# Patient Record
Sex: Male | Born: 1945 | Race: White | Hispanic: No | Marital: Single | Smoking: Former smoker
Health system: Southern US, Community
[De-identification: ages and names within clinical notes are randomized; demographics above are authoritative.]

## PROBLEM LIST (undated history)

## (undated) DIAGNOSIS — C801 Malignant (primary) neoplasm, unspecified: Secondary | ICD-10-CM

## (undated) DIAGNOSIS — E079 Disorder of thyroid, unspecified: Secondary | ICD-10-CM

## (undated) DIAGNOSIS — H409 Unspecified glaucoma: Secondary | ICD-10-CM

## (undated) DIAGNOSIS — B029 Zoster without complications: Secondary | ICD-10-CM

## (undated) HISTORY — PX: CHOLECYSTECTOMY: SHX55

## (undated) HISTORY — PX: OTHER SURGICAL HISTORY: SHX169

## (undated) SURGERY — SIGMOIDOSCOPY, FLEXIBLE
Anesthesia: Moderate Sedation

---

## 2001-07-29 ENCOUNTER — Encounter: Payer: Self-pay | Admitting: Family Medicine

## 2001-07-29 ENCOUNTER — Ambulatory Visit (HOSPITAL_COMMUNITY): Admission: RE | Admit: 2001-07-29 | Discharge: 2001-07-29 | Payer: Self-pay | Admitting: Family Medicine

## 2001-10-31 ENCOUNTER — Ambulatory Visit (HOSPITAL_COMMUNITY): Admission: RE | Admit: 2001-10-31 | Discharge: 2001-10-31 | Payer: Self-pay | Admitting: Internal Medicine

## 2003-02-05 ENCOUNTER — Other Ambulatory Visit: Admission: RE | Admit: 2003-02-05 | Discharge: 2003-02-05 | Payer: Self-pay | Admitting: Dermatology

## 2003-11-15 ENCOUNTER — Ambulatory Visit (HOSPITAL_COMMUNITY): Admission: RE | Admit: 2003-11-15 | Discharge: 2003-11-15 | Payer: Self-pay | Admitting: Family Medicine

## 2003-11-23 ENCOUNTER — Ambulatory Visit (HOSPITAL_COMMUNITY): Admission: RE | Admit: 2003-11-23 | Discharge: 2003-11-23 | Payer: Self-pay | Admitting: Family Medicine

## 2003-11-27 ENCOUNTER — Ambulatory Visit (HOSPITAL_COMMUNITY): Admission: RE | Admit: 2003-11-27 | Discharge: 2003-11-27 | Payer: Self-pay | Admitting: Internal Medicine

## 2004-02-14 ENCOUNTER — Other Ambulatory Visit: Admission: RE | Admit: 2004-02-14 | Discharge: 2004-02-14 | Payer: Self-pay | Admitting: Unknown Physician Specialty

## 2007-04-12 ENCOUNTER — Encounter (HOSPITAL_COMMUNITY): Admission: RE | Admit: 2007-04-12 | Discharge: 2007-05-12 | Payer: Self-pay | Admitting: Endocrinology

## 2007-04-26 ENCOUNTER — Encounter: Payer: Self-pay | Admitting: Endocrinology

## 2007-10-10 ENCOUNTER — Ambulatory Visit (HOSPITAL_COMMUNITY): Admission: RE | Admit: 2007-10-10 | Discharge: 2007-10-10 | Payer: Self-pay | Admitting: Family Medicine

## 2011-04-29 DIAGNOSIS — E890 Postprocedural hypothyroidism: Secondary | ICD-10-CM | POA: Diagnosis not present

## 2011-04-29 DIAGNOSIS — I10 Essential (primary) hypertension: Secondary | ICD-10-CM | POA: Diagnosis not present

## 2011-04-29 DIAGNOSIS — E039 Hypothyroidism, unspecified: Secondary | ICD-10-CM | POA: Diagnosis not present

## 2011-05-08 DIAGNOSIS — R059 Cough, unspecified: Secondary | ICD-10-CM | POA: Diagnosis not present

## 2011-05-08 DIAGNOSIS — Z6828 Body mass index (BMI) 28.0-28.9, adult: Secondary | ICD-10-CM | POA: Diagnosis not present

## 2011-05-08 DIAGNOSIS — J069 Acute upper respiratory infection, unspecified: Secondary | ICD-10-CM | POA: Diagnosis not present

## 2011-05-08 DIAGNOSIS — R05 Cough: Secondary | ICD-10-CM | POA: Diagnosis not present

## 2011-06-02 DIAGNOSIS — N401 Enlarged prostate with lower urinary tract symptoms: Secondary | ICD-10-CM | POA: Diagnosis not present

## 2011-06-02 DIAGNOSIS — N320 Bladder-neck obstruction: Secondary | ICD-10-CM | POA: Diagnosis not present

## 2011-06-02 DIAGNOSIS — N138 Other obstructive and reflux uropathy: Secondary | ICD-10-CM | POA: Diagnosis not present

## 2011-06-16 DIAGNOSIS — N401 Enlarged prostate with lower urinary tract symptoms: Secondary | ICD-10-CM | POA: Diagnosis not present

## 2011-06-16 DIAGNOSIS — N138 Other obstructive and reflux uropathy: Secondary | ICD-10-CM | POA: Diagnosis not present

## 2011-06-30 DIAGNOSIS — M19019 Primary osteoarthritis, unspecified shoulder: Secondary | ICD-10-CM | POA: Diagnosis not present

## 2011-09-02 DIAGNOSIS — Z006 Encounter for examination for normal comparison and control in clinical research program: Secondary | ICD-10-CM | POA: Diagnosis not present

## 2011-09-02 DIAGNOSIS — Z1289 Encounter for screening for malignant neoplasm of other sites: Secondary | ICD-10-CM | POA: Diagnosis not present

## 2011-09-02 DIAGNOSIS — C439 Malignant melanoma of skin, unspecified: Secondary | ICD-10-CM | POA: Diagnosis not present

## 2011-09-07 DIAGNOSIS — D485 Neoplasm of uncertain behavior of skin: Secondary | ICD-10-CM | POA: Diagnosis not present

## 2011-09-07 DIAGNOSIS — L570 Actinic keratosis: Secondary | ICD-10-CM | POA: Diagnosis not present

## 2011-09-07 DIAGNOSIS — Z85828 Personal history of other malignant neoplasm of skin: Secondary | ICD-10-CM | POA: Diagnosis not present

## 2011-10-12 DIAGNOSIS — M25569 Pain in unspecified knee: Secondary | ICD-10-CM | POA: Diagnosis not present

## 2011-10-24 DIAGNOSIS — K529 Noninfective gastroenteritis and colitis, unspecified: Secondary | ICD-10-CM | POA: Diagnosis not present

## 2011-10-24 DIAGNOSIS — B9789 Other viral agents as the cause of diseases classified elsewhere: Secondary | ICD-10-CM | POA: Diagnosis not present

## 2011-10-24 DIAGNOSIS — Z6828 Body mass index (BMI) 28.0-28.9, adult: Secondary | ICD-10-CM | POA: Diagnosis not present

## 2011-10-24 DIAGNOSIS — M159 Polyosteoarthritis, unspecified: Secondary | ICD-10-CM | POA: Diagnosis not present

## 2011-10-24 DIAGNOSIS — M549 Dorsalgia, unspecified: Secondary | ICD-10-CM | POA: Diagnosis not present

## 2011-12-08 DIAGNOSIS — R197 Diarrhea, unspecified: Secondary | ICD-10-CM | POA: Diagnosis not present

## 2011-12-11 DIAGNOSIS — E785 Hyperlipidemia, unspecified: Secondary | ICD-10-CM | POA: Diagnosis not present

## 2011-12-11 DIAGNOSIS — R197 Diarrhea, unspecified: Secondary | ICD-10-CM | POA: Diagnosis not present

## 2011-12-11 DIAGNOSIS — Z125 Encounter for screening for malignant neoplasm of prostate: Secondary | ICD-10-CM | POA: Diagnosis not present

## 2012-01-18 ENCOUNTER — Encounter (INDEPENDENT_AMBULATORY_CARE_PROVIDER_SITE_OTHER): Payer: Self-pay

## 2012-01-21 DIAGNOSIS — M19019 Primary osteoarthritis, unspecified shoulder: Secondary | ICD-10-CM | POA: Diagnosis not present

## 2012-03-09 DIAGNOSIS — Z006 Encounter for examination for normal comparison and control in clinical research program: Secondary | ICD-10-CM | POA: Diagnosis not present

## 2012-03-09 DIAGNOSIS — C439 Malignant melanoma of skin, unspecified: Secondary | ICD-10-CM | POA: Diagnosis not present

## 2012-03-09 DIAGNOSIS — Z0389 Encounter for observation for other suspected diseases and conditions ruled out: Secondary | ICD-10-CM | POA: Diagnosis not present

## 2012-03-15 DIAGNOSIS — Z85828 Personal history of other malignant neoplasm of skin: Secondary | ICD-10-CM | POA: Diagnosis not present

## 2012-03-15 DIAGNOSIS — L570 Actinic keratosis: Secondary | ICD-10-CM | POA: Diagnosis not present

## 2012-03-29 DIAGNOSIS — M159 Polyosteoarthritis, unspecified: Secondary | ICD-10-CM | POA: Diagnosis not present

## 2012-03-29 DIAGNOSIS — R51 Headache: Secondary | ICD-10-CM | POA: Diagnosis not present

## 2012-03-29 DIAGNOSIS — T148XXA Other injury of unspecified body region, initial encounter: Secondary | ICD-10-CM | POA: Diagnosis not present

## 2012-04-06 DIAGNOSIS — Z23 Encounter for immunization: Secondary | ICD-10-CM | POA: Diagnosis not present

## 2012-04-15 DIAGNOSIS — H269 Unspecified cataract: Secondary | ICD-10-CM | POA: Diagnosis not present

## 2012-04-15 DIAGNOSIS — H40019 Open angle with borderline findings, low risk, unspecified eye: Secondary | ICD-10-CM | POA: Diagnosis not present

## 2012-04-19 DIAGNOSIS — H2510 Age-related nuclear cataract, unspecified eye: Secondary | ICD-10-CM | POA: Diagnosis not present

## 2012-04-19 DIAGNOSIS — H04129 Dry eye syndrome of unspecified lacrimal gland: Secondary | ICD-10-CM | POA: Diagnosis not present

## 2012-04-19 DIAGNOSIS — H4011X0 Primary open-angle glaucoma, stage unspecified: Secondary | ICD-10-CM | POA: Diagnosis not present

## 2012-04-20 DIAGNOSIS — H01009 Unspecified blepharitis unspecified eye, unspecified eyelid: Secondary | ICD-10-CM | POA: Diagnosis not present

## 2012-04-20 DIAGNOSIS — H4011X0 Primary open-angle glaucoma, stage unspecified: Secondary | ICD-10-CM | POA: Diagnosis not present

## 2012-04-25 DIAGNOSIS — H4011X0 Primary open-angle glaucoma, stage unspecified: Secondary | ICD-10-CM | POA: Diagnosis not present

## 2012-04-25 DIAGNOSIS — H2510 Age-related nuclear cataract, unspecified eye: Secondary | ICD-10-CM | POA: Diagnosis not present

## 2012-04-25 DIAGNOSIS — H01009 Unspecified blepharitis unspecified eye, unspecified eyelid: Secondary | ICD-10-CM | POA: Diagnosis not present

## 2012-05-02 DIAGNOSIS — E890 Postprocedural hypothyroidism: Secondary | ICD-10-CM | POA: Diagnosis not present

## 2012-05-04 DIAGNOSIS — I10 Essential (primary) hypertension: Secondary | ICD-10-CM | POA: Diagnosis not present

## 2012-05-04 DIAGNOSIS — E890 Postprocedural hypothyroidism: Secondary | ICD-10-CM | POA: Diagnosis not present

## 2012-05-26 DIAGNOSIS — H4011X0 Primary open-angle glaucoma, stage unspecified: Secondary | ICD-10-CM | POA: Diagnosis not present

## 2012-05-26 DIAGNOSIS — H2510 Age-related nuclear cataract, unspecified eye: Secondary | ICD-10-CM | POA: Diagnosis not present

## 2012-05-26 DIAGNOSIS — H04129 Dry eye syndrome of unspecified lacrimal gland: Secondary | ICD-10-CM | POA: Diagnosis not present

## 2012-06-13 DIAGNOSIS — M25519 Pain in unspecified shoulder: Secondary | ICD-10-CM | POA: Diagnosis not present

## 2012-06-13 DIAGNOSIS — M47812 Spondylosis without myelopathy or radiculopathy, cervical region: Secondary | ICD-10-CM | POA: Diagnosis not present

## 2012-06-13 DIAGNOSIS — M19019 Primary osteoarthritis, unspecified shoulder: Secondary | ICD-10-CM | POA: Diagnosis not present

## 2012-06-13 DIAGNOSIS — M542 Cervicalgia: Secondary | ICD-10-CM | POA: Diagnosis not present

## 2012-06-16 DIAGNOSIS — M47812 Spondylosis without myelopathy or radiculopathy, cervical region: Secondary | ICD-10-CM | POA: Diagnosis not present

## 2012-06-16 DIAGNOSIS — S4350XA Sprain of unspecified acromioclavicular joint, initial encounter: Secondary | ICD-10-CM | POA: Diagnosis not present

## 2012-06-22 DIAGNOSIS — H4011X0 Primary open-angle glaucoma, stage unspecified: Secondary | ICD-10-CM | POA: Diagnosis not present

## 2012-06-22 DIAGNOSIS — H2510 Age-related nuclear cataract, unspecified eye: Secondary | ICD-10-CM | POA: Diagnosis not present

## 2012-06-29 DIAGNOSIS — H4011X0 Primary open-angle glaucoma, stage unspecified: Secondary | ICD-10-CM | POA: Diagnosis not present

## 2012-07-06 DIAGNOSIS — L259 Unspecified contact dermatitis, unspecified cause: Secondary | ICD-10-CM | POA: Diagnosis not present

## 2012-07-06 DIAGNOSIS — L570 Actinic keratosis: Secondary | ICD-10-CM | POA: Diagnosis not present

## 2012-07-06 DIAGNOSIS — B029 Zoster without complications: Secondary | ICD-10-CM | POA: Diagnosis not present

## 2012-07-06 DIAGNOSIS — D485 Neoplasm of uncertain behavior of skin: Secondary | ICD-10-CM | POA: Diagnosis not present

## 2012-07-06 DIAGNOSIS — L988 Other specified disorders of the skin and subcutaneous tissue: Secondary | ICD-10-CM | POA: Diagnosis not present

## 2012-07-13 DIAGNOSIS — H2510 Age-related nuclear cataract, unspecified eye: Secondary | ICD-10-CM | POA: Diagnosis not present

## 2012-07-13 DIAGNOSIS — H4011X0 Primary open-angle glaucoma, stage unspecified: Secondary | ICD-10-CM | POA: Diagnosis not present

## 2012-07-19 DIAGNOSIS — H2510 Age-related nuclear cataract, unspecified eye: Secondary | ICD-10-CM | POA: Diagnosis not present

## 2012-07-19 DIAGNOSIS — H4011X0 Primary open-angle glaucoma, stage unspecified: Secondary | ICD-10-CM | POA: Diagnosis not present

## 2012-07-20 DIAGNOSIS — R972 Elevated prostate specific antigen [PSA]: Secondary | ICD-10-CM | POA: Diagnosis not present

## 2012-07-20 DIAGNOSIS — B0229 Other postherpetic nervous system involvement: Secondary | ICD-10-CM | POA: Diagnosis not present

## 2012-07-20 DIAGNOSIS — B029 Zoster without complications: Secondary | ICD-10-CM | POA: Diagnosis not present

## 2012-07-25 DIAGNOSIS — N400 Enlarged prostate without lower urinary tract symptoms: Secondary | ICD-10-CM | POA: Diagnosis not present

## 2012-08-02 DIAGNOSIS — E890 Postprocedural hypothyroidism: Secondary | ICD-10-CM | POA: Diagnosis not present

## 2012-08-03 DIAGNOSIS — H2510 Age-related nuclear cataract, unspecified eye: Secondary | ICD-10-CM | POA: Diagnosis not present

## 2012-08-03 DIAGNOSIS — H04129 Dry eye syndrome of unspecified lacrimal gland: Secondary | ICD-10-CM | POA: Diagnosis not present

## 2012-08-03 DIAGNOSIS — H4011X0 Primary open-angle glaucoma, stage unspecified: Secondary | ICD-10-CM | POA: Diagnosis not present

## 2012-08-10 DIAGNOSIS — B029 Zoster without complications: Secondary | ICD-10-CM | POA: Diagnosis not present

## 2012-08-10 DIAGNOSIS — E890 Postprocedural hypothyroidism: Secondary | ICD-10-CM | POA: Diagnosis not present

## 2012-08-13 ENCOUNTER — Encounter (HOSPITAL_COMMUNITY): Payer: Self-pay

## 2012-08-13 ENCOUNTER — Emergency Department (HOSPITAL_COMMUNITY)
Admission: EM | Admit: 2012-08-13 | Discharge: 2012-08-14 | Disposition: A | Discharge status: Home / Self Care | Payer: Medicare Other | Attending: Emergency Medicine | Admitting: Emergency Medicine

## 2012-08-13 DIAGNOSIS — Z862 Personal history of diseases of the blood and blood-forming organs and certain disorders involving the immune mechanism: Secondary | ICD-10-CM | POA: Diagnosis not present

## 2012-08-13 DIAGNOSIS — Z8619 Personal history of other infectious and parasitic diseases: Secondary | ICD-10-CM | POA: Insufficient documentation

## 2012-08-13 DIAGNOSIS — Z87891 Personal history of nicotine dependence: Secondary | ICD-10-CM | POA: Diagnosis not present

## 2012-08-13 DIAGNOSIS — R197 Diarrhea, unspecified: Secondary | ICD-10-CM | POA: Diagnosis not present

## 2012-08-13 DIAGNOSIS — Z8669 Personal history of other diseases of the nervous system and sense organs: Secondary | ICD-10-CM | POA: Insufficient documentation

## 2012-08-13 DIAGNOSIS — R112 Nausea with vomiting, unspecified: Secondary | ICD-10-CM

## 2012-08-13 DIAGNOSIS — Z8639 Personal history of other endocrine, nutritional and metabolic disease: Secondary | ICD-10-CM | POA: Insufficient documentation

## 2012-08-13 DIAGNOSIS — R1110 Vomiting, unspecified: Secondary | ICD-10-CM | POA: Diagnosis not present

## 2012-08-13 HISTORY — DX: Disorder of thyroid, unspecified: E07.9

## 2012-08-13 HISTORY — DX: Zoster without complications: B02.9

## 2012-08-13 HISTORY — DX: Unspecified glaucoma: H40.9

## 2012-08-13 NOTE — ED Notes (Signed)
I think I have a stomach bug, having vomiting and diarrhea per pt. Having a lot of gas per pt.

## 2012-08-14 ENCOUNTER — Emergency Department (HOSPITAL_COMMUNITY): Payer: Medicare Other

## 2012-08-14 DIAGNOSIS — R197 Diarrhea, unspecified: Secondary | ICD-10-CM | POA: Diagnosis not present

## 2012-08-14 DIAGNOSIS — R1110 Vomiting, unspecified: Secondary | ICD-10-CM | POA: Diagnosis not present

## 2012-08-14 LAB — CBC WITH DIFFERENTIAL/PLATELET
Basophils Relative: 0 % (ref 0–1)
Eosinophils Relative: 1 % (ref 0–5)
HCT: 40.4 % (ref 39.0–52.0)
Hemoglobin: 14.4 g/dL (ref 13.0–17.0)
MCHC: 35.6 g/dL (ref 30.0–36.0)
MCV: 93.5 fL (ref 78.0–100.0)
Monocytes Absolute: 1 10*3/uL (ref 0.1–1.0)
Monocytes Relative: 16 % — ABNORMAL HIGH (ref 3–12)
Neutro Abs: 3.6 10*3/uL (ref 1.7–7.7)
RDW: 13.1 % (ref 11.5–15.5)

## 2012-08-14 LAB — BASIC METABOLIC PANEL
BUN: 10 mg/dL (ref 6–23)
CO2: 25 mEq/L (ref 19–32)
Calcium: 9.2 mg/dL (ref 8.4–10.5)
Chloride: 103 mEq/L (ref 96–112)
Creatinine, Ser: 0.74 mg/dL (ref 0.50–1.35)
GFR calc Af Amer: >90 mL/min (ref >90)

## 2012-08-14 LAB — URINALYSIS, ROUTINE W REFLEX MICROSCOPIC
Bilirubin Urine: NEGATIVE
Glucose, UA: NEGATIVE mg/dL
Protein, ur: NEGATIVE mg/dL
pH: 6 (ref 5.0–8.0)

## 2012-08-14 MED ORDER — HYDROMORPHONE HCL PF 1 MG/ML IJ SOLN
1.0000 mg | Freq: Once | INTRAMUSCULAR | Status: AC
  Administered 2012-08-14: 1 mg via INTRAVENOUS
  Filled 2012-08-14: qty 1

## 2012-08-14 MED ORDER — SODIUM CHLORIDE 0.9 % IV BOLUS (SEPSIS)
1000.0000 mL | Freq: Once | INTRAVENOUS | Status: AC
  Administered 2012-08-14: 1000 mL via INTRAVENOUS

## 2012-08-14 MED ORDER — ONDANSETRON HCL 4 MG/2ML IJ SOLN
4.0000 mg | Freq: Once | INTRAMUSCULAR | Status: AC
  Administered 2012-08-14: 4 mg via INTRAVENOUS
  Filled 2012-08-14: qty 2

## 2012-08-14 MED ORDER — ONDANSETRON 4 MG PO TBDP: 4.0000 mg | ORAL_TABLET | Freq: Three times a day (TID) | ORAL | Status: DC | PRN

## 2012-08-14 NOTE — ED Provider Notes (Addendum)
History     CSN: [id number]  Arrival date & time 08/13/12  2123   First MD Initiated Contact with Patient 08/13/12 2315      Chief Complaint  Patient presents with  . Emesis  . Diarrhea    (Consider location/radiation/quality/duration/timing/severity/associated sxs/prior treatment) HPI HPI Comments: Darryl Darryl is a 67 y.o. male who presents to the Emergency Department complaining of nausea, vomiting and diarrhea x 3 days.  He has been unable to keep anything down today. Denies fever, chills.   PCP Dr. Phillips Phillips Past Medical History  Diagnosis Date  . Thyroid disease   . Glaucoma   . Shingles     Past Surgical History  Procedure Laterality Date  . Melonoma      No family history on file.  History  Substance Use Topics  . Smoking status: Former Games developer  . Smokeless tobacco: Not on file  . Alcohol Use: No      Review of Systems  Constitutional: Negative for fever.       10 Systems reviewed and are negative for acute change except as noted in the HPI.  HENT: Negative for congestion.   Eyes: Negative for discharge and redness.  Respiratory: Negative for cough and shortness of breath.   Cardiovascular: Negative for chest pain.  Gastrointestinal: Positive for nausea, vomiting and diarrhea. Negative for abdominal pain.  Musculoskeletal: Negative for back pain.  Skin: Negative for rash.  Neurological: Negative for syncope, numbness and headaches.  Psychiatric/Behavioral:       No behavior change.    Allergies  Tetracyclines & related  Home Medications  No current outpatient prescriptions on file.  BP 130/72  Pulse 73  Temp(Src) 98.8 F (37.1 C) (Oral)  Resp 20  SpO2 97%  Physical Exam  Nursing note and vitals reviewed. Constitutional: He appears well-developed and well-nourished.  Awake, alert, nontoxic appearance.  HENT:  Head: Normocephalic and atraumatic.  Eyes: EOM are normal. Pupils are equal, round, and reactive to light.  Neck: Normal  range of motion. Neck supple.  Cardiovascular: Normal rate and intact distal pulses.   Pulmonary/Chest: Effort normal and breath sounds normal. He exhibits no tenderness.  Abdominal: Soft. Bowel sounds are normal. There is no tenderness. There is no rebound.  Musculoskeletal: He exhibits no tenderness.  Baseline ROM, no obvious new focal weakness.  Neurological:  Mental status and motor strength appears baseline for patient and situation.  Skin: No rash noted.  Psychiatric: He has a normal mood and affect.    ED Course  Procedures (including critical care time) Results for orders placed during the hospital encounter of 08/13/12  CBC WITH DIFFERENTIAL      Result Value Range   WBC 6.1  4.0 - 10.5 K/uL   RBC 4.32  4.22 - 5.81 MIL/uL   Hemoglobin 14.4  13.0 - 17.0 g/dL   HCT 40.9  81.1 - 91.4 %   MCV 93.5  78.0 - 100.0 fL   MCH 33.3  26.0 - 34.0 pg   MCHC 35.6  30.0 - 36.0 g/dL   RDW 78.2  95.6 - 21.3 %   Platelets 293  150 - 400 K/uL   Neutrophils Relative % 59  43 - 77 %   Neutro Abs 3.6  1.7 - 7.7 K/uL   Lymphocytes Relative 23  12 - 46 %   Lymphs Abs 1.4  0.7 - 4.0 K/uL   Monocytes Relative 16 (*) 3 - 12 %   Monocytes Absolute 1.0  0.1 -  1.0 K/uL   Eosinophils Relative 1  0 - 5 %   Eosinophils Absolute 0.1  0.0 - 0.7 K/uL   Basophils Relative 0  0 - 1 %   Basophils Absolute 0.0  0.0 - 0.1 K/uL  BASIC METABOLIC PANEL      Result Value Range   Sodium 139  135 - 145 mEq/L   Potassium 3.3 (*) 3.5 - 5.1 mEq/L   Chloride 103  96 - 112 mEq/L   CO2 25  19 - 32 mEq/L   Glucose, Bld 122 (*) 70 - 99 mg/dL   BUN 10  6 - 23 mg/dL   Creatinine, Ser 1.61  0.50 - 1.35 mg/dL   Calcium 9.2  8.4 - 09.6 mg/dL   GFR calc non Af Amer >90  >90 mL/min   GFR calc Af Amer >90  >90 mL/min  URINALYSIS, ROUTINE W REFLEX MICROSCOPIC      Result Value Range   Color, Urine YELLOW  YELLOW   APPearance CLEAR  CLEAR   Specific Gravity, Urine >1.030 (*) 1.005 - 1.030   pH 6.0  5.0 - 8.0   Glucose,  UA NEGATIVE  NEGATIVE mg/dL   Hgb urine dipstick NEGATIVE  NEGATIVE   Bilirubin Urine NEGATIVE  NEGATIVE   Ketones, ur TRACE (*) NEGATIVE mg/dL   Protein, ur NEGATIVE  NEGATIVE mg/dL   Urobilinogen, UA 0.2  0.0 - 1.0 mg/dL   Nitrite NEGATIVE  NEGATIVE   Leukocytes, UA NEGATIVE  NEGATIVE    Dg Abd Acute W/chest  08/14/2012   *RADIOLOGY REPORT*  Clinical Data: Vomiting and diarrhea  ACUTE ABDOMEN SERIES (ABDOMEN 2 VIEW & CHEST 1 VIEW)  Comparison: 10/10/07  Findings: Heart size is normal.  No pleural effusion or edema. Atelectasis is noted in both lung bases.  There is asymmetric elevation of the right hemidiaphragm.  Cholecystectomy clips are present in the right upper quadrant of the abdomen.  No abnormally dilated loops of small bowel identified.  No small bowel air-fluid levels identified.  There is gas and stool noted within the colon up to the rectum.  Mild multilevel spondylosis identified within the thoracic spine.  IMPRESSION:  1.  Nonobstructive bowel gas pattern. 2.  Prior cholecystectomy.   Original Report Authenticated By: Darryl Darryl, M.D.    Medications  HYDROmorphone (DILAUDID) injection 1 mg (1 mg Intravenous Given 08/14/12 0048)  ondansetron (ZOFRAN) injection 4 mg (4 mg Intravenous Given 08/14/12 0045)  sodium chloride 0.9 % bolus 1,000 mL (0 mLs Intravenous Stopped 08/14/12 0130)  sodium chloride 0.9 % bolus 1,000 mL (1,000 mLs Intravenous New Bag/Given 08/14/12 0214)      MDM  Patient with GI illness characterized by nausea, vomiting and diarrhea x 2 days. Given IVF, dilaudid and zofran with relief. Able to take PO fluids. Labs and xray were normal. Pt stable in ED with no significant deterioration in condition.The patient appears reasonably screened and/or stabilized for discharge and I doubt any other medical condition or other [facility] requiring further screening, evaluation, or treatment in the ED at this time prior to discharge.  MDM Reviewed: nursing note and  vitals Interpretation: labs           Nicoletta Dress. Colon Branch, MD 08/14/12 0310  Nicoletta Dress. Colon Branch, MD 08/14/12 [id number]

## 2012-09-12 DIAGNOSIS — D485 Neoplasm of uncertain behavior of skin: Secondary | ICD-10-CM | POA: Diagnosis not present

## 2012-09-12 DIAGNOSIS — Z85828 Personal history of other malignant neoplasm of skin: Secondary | ICD-10-CM | POA: Diagnosis not present

## 2012-09-12 DIAGNOSIS — L570 Actinic keratosis: Secondary | ICD-10-CM | POA: Diagnosis not present

## 2012-09-21 DIAGNOSIS — C439 Malignant melanoma of skin, unspecified: Secondary | ICD-10-CM | POA: Diagnosis not present

## 2012-09-21 DIAGNOSIS — J9819 Other pulmonary collapse: Secondary | ICD-10-CM | POA: Diagnosis not present

## 2012-09-21 DIAGNOSIS — Z0389 Encounter for observation for other suspected diseases and conditions ruled out: Secondary | ICD-10-CM | POA: Diagnosis not present

## 2012-09-28 DIAGNOSIS — Z6828 Body mass index (BMI) 28.0-28.9, adult: Secondary | ICD-10-CM | POA: Diagnosis not present

## 2012-09-28 DIAGNOSIS — J019 Acute sinusitis, unspecified: Secondary | ICD-10-CM | POA: Diagnosis not present

## 2012-09-28 DIAGNOSIS — I10 Essential (primary) hypertension: Secondary | ICD-10-CM | POA: Diagnosis not present

## 2012-09-28 DIAGNOSIS — E039 Hypothyroidism, unspecified: Secondary | ICD-10-CM | POA: Diagnosis not present

## 2012-11-08 DIAGNOSIS — Z6827 Body mass index (BMI) 27.0-27.9, adult: Secondary | ICD-10-CM | POA: Diagnosis not present

## 2012-11-08 DIAGNOSIS — N410 Acute prostatitis: Secondary | ICD-10-CM | POA: Diagnosis not present

## 2012-11-15 DIAGNOSIS — H4011X0 Primary open-angle glaucoma, stage unspecified: Secondary | ICD-10-CM | POA: Diagnosis not present

## 2012-11-15 DIAGNOSIS — H2510 Age-related nuclear cataract, unspecified eye: Secondary | ICD-10-CM | POA: Diagnosis not present

## 2012-11-15 DIAGNOSIS — H04129 Dry eye syndrome of unspecified lacrimal gland: Secondary | ICD-10-CM | POA: Diagnosis not present

## 2012-12-13 DIAGNOSIS — Z23 Encounter for immunization: Secondary | ICD-10-CM | POA: Diagnosis not present

## 2013-01-18 DIAGNOSIS — Z85828 Personal history of other malignant neoplasm of skin: Secondary | ICD-10-CM | POA: Diagnosis not present

## 2013-01-18 DIAGNOSIS — D485 Neoplasm of uncertain behavior of skin: Secondary | ICD-10-CM | POA: Diagnosis not present

## 2013-01-18 DIAGNOSIS — L988 Other specified disorders of the skin and subcutaneous tissue: Secondary | ICD-10-CM | POA: Diagnosis not present

## 2013-01-18 DIAGNOSIS — L570 Actinic keratosis: Secondary | ICD-10-CM | POA: Diagnosis not present

## 2013-01-25 DIAGNOSIS — N400 Enlarged prostate without lower urinary tract symptoms: Secondary | ICD-10-CM | POA: Diagnosis not present

## 2013-01-30 DIAGNOSIS — I10 Essential (primary) hypertension: Secondary | ICD-10-CM | POA: Diagnosis not present

## 2013-01-30 DIAGNOSIS — J309 Allergic rhinitis, unspecified: Secondary | ICD-10-CM | POA: Diagnosis not present

## 2013-01-30 DIAGNOSIS — Z6827 Body mass index (BMI) 27.0-27.9, adult: Secondary | ICD-10-CM | POA: Diagnosis not present

## 2013-02-16 DIAGNOSIS — Z6827 Body mass index (BMI) 27.0-27.9, adult: Secondary | ICD-10-CM | POA: Diagnosis not present

## 2013-02-16 DIAGNOSIS — J019 Acute sinusitis, unspecified: Secondary | ICD-10-CM | POA: Diagnosis not present

## 2013-02-16 DIAGNOSIS — N508 Other specified disorders of male genital organs: Secondary | ICD-10-CM | POA: Diagnosis not present

## 2013-02-16 DIAGNOSIS — J3489 Other specified disorders of nose and nasal sinuses: Secondary | ICD-10-CM | POA: Diagnosis not present

## 2013-03-03 DIAGNOSIS — I700 Atherosclerosis of aorta: Secondary | ICD-10-CM | POA: Diagnosis not present

## 2013-03-03 DIAGNOSIS — Z9089 Acquired absence of other organs: Secondary | ICD-10-CM | POA: Diagnosis not present

## 2013-03-03 DIAGNOSIS — IMO0002 Reserved for concepts with insufficient information to code with codable children: Secondary | ICD-10-CM | POA: Diagnosis not present

## 2013-03-03 DIAGNOSIS — Z0389 Encounter for observation for other suspected diseases and conditions ruled out: Secondary | ICD-10-CM | POA: Diagnosis not present

## 2013-03-03 DIAGNOSIS — Z006 Encounter for examination for normal comparison and control in clinical research program: Secondary | ICD-10-CM | POA: Diagnosis not present

## 2013-03-03 DIAGNOSIS — C439 Malignant melanoma of skin, unspecified: Secondary | ICD-10-CM | POA: Diagnosis not present

## 2013-03-03 DIAGNOSIS — K4020 Bilateral inguinal hernia, without obstruction or gangrene, not specified as recurrent: Secondary | ICD-10-CM | POA: Diagnosis not present

## 2013-03-14 DIAGNOSIS — L570 Actinic keratosis: Secondary | ICD-10-CM | POA: Diagnosis not present

## 2013-03-14 DIAGNOSIS — C44519 Basal cell carcinoma of skin of other part of trunk: Secondary | ICD-10-CM | POA: Diagnosis not present

## 2013-03-14 DIAGNOSIS — Z85828 Personal history of other malignant neoplasm of skin: Secondary | ICD-10-CM | POA: Diagnosis not present

## 2013-03-14 DIAGNOSIS — D485 Neoplasm of uncertain behavior of skin: Secondary | ICD-10-CM | POA: Diagnosis not present

## 2013-03-21 DIAGNOSIS — C44519 Basal cell carcinoma of skin of other part of trunk: Secondary | ICD-10-CM | POA: Diagnosis not present

## 2013-05-15 DIAGNOSIS — Z6828 Body mass index (BMI) 28.0-28.9, adult: Secondary | ICD-10-CM | POA: Diagnosis not present

## 2013-05-15 DIAGNOSIS — Z125 Encounter for screening for malignant neoplasm of prostate: Secondary | ICD-10-CM | POA: Diagnosis not present

## 2013-05-15 DIAGNOSIS — E781 Pure hyperglyceridemia: Secondary | ICD-10-CM | POA: Diagnosis not present

## 2013-05-15 DIAGNOSIS — I10 Essential (primary) hypertension: Secondary | ICD-10-CM | POA: Diagnosis not present

## 2013-05-15 DIAGNOSIS — E039 Hypothyroidism, unspecified: Secondary | ICD-10-CM | POA: Diagnosis not present

## 2013-05-15 DIAGNOSIS — E890 Postprocedural hypothyroidism: Secondary | ICD-10-CM | POA: Diagnosis not present

## 2013-06-12 DIAGNOSIS — H4011X0 Primary open-angle glaucoma, stage unspecified: Secondary | ICD-10-CM | POA: Diagnosis not present

## 2013-06-12 DIAGNOSIS — H2510 Age-related nuclear cataract, unspecified eye: Secondary | ICD-10-CM | POA: Diagnosis not present

## 2013-06-15 ENCOUNTER — Ambulatory Visit (INDEPENDENT_AMBULATORY_CARE_PROVIDER_SITE_OTHER): Payer: Medicare Other | Admitting: Otolaryngology

## 2013-06-15 DIAGNOSIS — J343 Hypertrophy of nasal turbinates: Secondary | ICD-10-CM | POA: Diagnosis not present

## 2013-06-15 DIAGNOSIS — J342 Deviated nasal septum: Secondary | ICD-10-CM

## 2013-07-13 ENCOUNTER — Ambulatory Visit (INDEPENDENT_AMBULATORY_CARE_PROVIDER_SITE_OTHER): Payer: Medicare Other | Admitting: Otolaryngology

## 2013-07-13 DIAGNOSIS — J310 Chronic rhinitis: Secondary | ICD-10-CM | POA: Diagnosis not present

## 2013-07-18 DIAGNOSIS — K219 Gastro-esophageal reflux disease without esophagitis: Secondary | ICD-10-CM | POA: Diagnosis not present

## 2013-07-18 DIAGNOSIS — Z6828 Body mass index (BMI) 28.0-28.9, adult: Secondary | ICD-10-CM | POA: Diagnosis not present

## 2013-08-07 ENCOUNTER — Other Ambulatory Visit: Payer: Medicare Other

## 2013-08-09 ENCOUNTER — Ambulatory Visit: Payer: Medicare Other | Admitting: Endocrinology

## 2013-09-01 DIAGNOSIS — Z006 Encounter for examination for normal comparison and control in clinical research program: Secondary | ICD-10-CM | POA: Diagnosis not present

## 2013-09-01 DIAGNOSIS — C434 Malignant melanoma of scalp and neck: Secondary | ICD-10-CM | POA: Diagnosis not present

## 2013-09-01 DIAGNOSIS — Z483 Aftercare following surgery for neoplasm: Secondary | ICD-10-CM | POA: Diagnosis not present

## 2013-09-01 DIAGNOSIS — Z0389 Encounter for observation for other suspected diseases and conditions ruled out: Secondary | ICD-10-CM | POA: Diagnosis not present

## 2013-09-01 DIAGNOSIS — C439 Malignant melanoma of skin, unspecified: Secondary | ICD-10-CM | POA: Diagnosis not present

## 2013-09-12 DIAGNOSIS — L570 Actinic keratosis: Secondary | ICD-10-CM | POA: Diagnosis not present

## 2013-09-12 DIAGNOSIS — Z85828 Personal history of other malignant neoplasm of skin: Secondary | ICD-10-CM | POA: Diagnosis not present

## 2013-10-03 DIAGNOSIS — Z6827 Body mass index (BMI) 27.0-27.9, adult: Secondary | ICD-10-CM | POA: Diagnosis not present

## 2013-10-03 DIAGNOSIS — J209 Acute bronchitis, unspecified: Secondary | ICD-10-CM | POA: Diagnosis not present

## 2013-10-11 DIAGNOSIS — IMO0002 Reserved for concepts with insufficient information to code with codable children: Secondary | ICD-10-CM | POA: Diagnosis not present

## 2013-10-11 DIAGNOSIS — I10 Essential (primary) hypertension: Secondary | ICD-10-CM | POA: Diagnosis not present

## 2013-10-11 DIAGNOSIS — Z87891 Personal history of nicotine dependence: Secondary | ICD-10-CM | POA: Diagnosis not present

## 2013-10-11 DIAGNOSIS — Z881 Allergy status to other antibiotic agents status: Secondary | ICD-10-CM | POA: Diagnosis not present

## 2013-10-12 ENCOUNTER — Encounter (INDEPENDENT_AMBULATORY_CARE_PROVIDER_SITE_OTHER): Payer: Self-pay | Admitting: *Deleted

## 2013-10-15 ENCOUNTER — Emergency Department (HOSPITAL_COMMUNITY)
Admission: EM | Admit: 2013-10-15 | Discharge: 2013-10-15 | Disposition: A | Discharge status: Home / Self Care | Payer: Medicare Other | Attending: Emergency Medicine | Admitting: Emergency Medicine

## 2013-10-15 ENCOUNTER — Encounter (HOSPITAL_COMMUNITY): Payer: Self-pay | Admitting: Emergency Medicine

## 2013-10-15 DIAGNOSIS — Z87891 Personal history of nicotine dependence: Secondary | ICD-10-CM | POA: Insufficient documentation

## 2013-10-15 DIAGNOSIS — R112 Nausea with vomiting, unspecified: Secondary | ICD-10-CM

## 2013-10-15 DIAGNOSIS — Z79899 Other long term (current) drug therapy: Secondary | ICD-10-CM | POA: Diagnosis not present

## 2013-10-15 DIAGNOSIS — E079 Disorder of thyroid, unspecified: Secondary | ICD-10-CM | POA: Diagnosis not present

## 2013-10-15 DIAGNOSIS — R109 Unspecified abdominal pain: Secondary | ICD-10-CM | POA: Diagnosis not present

## 2013-10-15 DIAGNOSIS — H409 Unspecified glaucoma: Secondary | ICD-10-CM | POA: Insufficient documentation

## 2013-10-15 DIAGNOSIS — R197 Diarrhea, unspecified: Secondary | ICD-10-CM | POA: Insufficient documentation

## 2013-10-15 DIAGNOSIS — Z85820 Personal history of malignant melanoma of skin: Secondary | ICD-10-CM | POA: Diagnosis not present

## 2013-10-15 DIAGNOSIS — Z8619 Personal history of other infectious and parasitic diseases: Secondary | ICD-10-CM | POA: Insufficient documentation

## 2013-10-15 HISTORY — DX: Malignant (primary) neoplasm, unspecified: C80.1

## 2013-10-15 LAB — CBC WITH DIFFERENTIAL/PLATELET
BASOS ABS: 0 10*3/uL (ref 0.0–0.1)
Basophils Relative: 0 % (ref 0–1)
Eosinophils Absolute: 0 10*3/uL (ref 0.0–0.7)
Eosinophils Relative: 0 % (ref 0–5)
HCT: 42.9 % (ref 39.0–52.0)
Hemoglobin: 15.1 g/dL (ref 13.0–17.0)
LYMPHS ABS: 0.5 10*3/uL — AB (ref 0.7–4.0)
LYMPHS PCT: 5 % — AB (ref 12–46)
MCH: 33 pg (ref 26.0–34.0)
MCHC: 35.2 g/dL (ref 30.0–36.0)
MCV: 93.7 fL (ref 78.0–100.0)
Monocytes Absolute: 0.6 10*3/uL (ref 0.1–1.0)
Monocytes Relative: 5 % (ref 3–12)
NEUTROS ABS: 9.3 10*3/uL — AB (ref 1.7–7.7)
NEUTROS PCT: 90 % — AB (ref 43–77)
PLATELETS: 236 10*3/uL (ref 150–400)
RBC: 4.58 MIL/uL (ref 4.22–5.81)
RDW: 12.8 % (ref 11.5–15.5)
WBC: 10.3 10*3/uL (ref 4.0–10.5)

## 2013-10-15 LAB — BASIC METABOLIC PANEL
ANION GAP: 11 (ref 5–15)
BUN: 19 mg/dL (ref 6–23)
CO2: 24 meq/L (ref 19–32)
Calcium: 8.9 mg/dL (ref 8.4–10.5)
Chloride: 104 mEq/L (ref 96–112)
Creatinine, Ser: 0.83 mg/dL (ref 0.50–1.35)
GFR calc Af Amer: >90 mL/min (ref >90)
GFR calc non Af Amer: 87 mL/min — ABNORMAL LOW (ref >90)
GLUCOSE: 117 mg/dL — AB (ref 70–99)
POTASSIUM: 4.8 meq/L (ref 3.7–5.3)
SODIUM: 139 meq/L (ref 137–147)

## 2013-10-15 MED ORDER — ONDANSETRON HCL 4 MG PO TABS: 4.0000 mg | ORAL_TABLET | Freq: Four times a day (QID) | ORAL | Status: DC | PRN

## 2013-10-15 MED ORDER — SODIUM CHLORIDE 0.9 % IV SOLN: 1000.0000 mL | INTRAVENOUS | Status: DC

## 2013-10-15 MED ORDER — ONDANSETRON HCL 4 MG/2ML IJ SOLN
4.0000 mg | Freq: Once | INTRAMUSCULAR | Status: AC
  Administered 2013-10-15: 4 mg via INTRAVENOUS
  Filled 2013-10-15: qty 2

## 2013-10-15 MED ORDER — SODIUM CHLORIDE 0.9 % IV SOLN
1000.0000 mL | Freq: Once | INTRAVENOUS | Status: AC
  Administered 2013-10-15: 1000 mL via INTRAVENOUS

## 2013-10-15 NOTE — Discharge Instructions (Signed)
Nausea and Vomiting °Nausea is a sick feeling that often comes before throwing up (vomiting). Vomiting is a reflex where stomach contents come out of your mouth. Vomiting can cause severe loss of body fluids (dehydration). Children and elderly adults can become dehydrated quickly, especially if they also have diarrhea. Nausea and vomiting are symptoms of a condition or disease. It is important to find the cause of your symptoms. °CAUSES  °· Direct irritation of the stomach lining. This irritation can result from increased acid production (gastroesophageal reflux disease), infection, food poisoning, taking certain medicines (such as nonsteroidal anti-inflammatory drugs), alcohol use, or tobacco use. °· Signals from the brain. These signals could be caused by a headache, heat exposure, an inner ear disturbance, increased pressure in the brain from injury, infection, a tumor, or a concussion, pain, emotional stimulus, or metabolic problems. °· An obstruction in the gastrointestinal tract (bowel obstruction). °· Illnesses such as diabetes, hepatitis, gallbladder problems, appendicitis, kidney problems, cancer, sepsis, atypical symptoms of a heart attack, or eating disorders. °· Medical treatments such as chemotherapy and radiation. °· Receiving medicine that makes you sleep (general anesthetic) during surgery. °DIAGNOSIS °Your caregiver may ask for tests to be done if the problems do not improve after a few days. Tests may also be done if symptoms are severe or if the reason for the nausea and vomiting is not clear. Tests may include: °· Urine tests. °· Blood tests. °· Stool tests. °· Cultures (to look for evidence of infection). °· X-rays or other imaging studies. °Test results can help your caregiver make decisions about treatment or the need for additional tests. °TREATMENT °You need to stay well hydrated. Drink frequently but in small amounts. You may wish to drink water, sports drinks, clear broth, or eat frozen  ice pops or gelatin dessert to help stay hydrated. When you eat, eating slowly may help prevent nausea. There are also some antinausea medicines that may help prevent nausea. °HOME CARE INSTRUCTIONS  °· Take all medicine as directed by your caregiver. °· If you do not have an appetite, do not force yourself to eat. However, you must continue to drink fluids. °· If you have an appetite, eat a normal diet unless your caregiver tells you differently. °¨ Eat a variety of complex carbohydrates (rice, wheat, potatoes, bread), lean meats, yogurt, fruits, and vegetables. °¨ Avoid high-fat foods because they are more difficult to digest. °· Drink enough water and fluids to keep your urine clear or pale yellow. °· If you are dehydrated, ask your caregiver for specific rehydration instructions. Signs of dehydration may include: °¨ Severe thirst. °¨ Dry lips and mouth. °¨ Dizziness. °¨ Dark urine. °¨ Decreasing urine frequency and amount. °¨ Confusion. °¨ Rapid breathing or pulse. °SEEK IMMEDIATE MEDICAL CARE IF:  °· You have blood or brown flecks (like coffee grounds) in your vomit. °· You have black or bloody stools. °· You have a severe headache or stiff neck. °· You are confused. °· You have severe abdominal pain. °· You have chest pain or trouble breathing. °· You do not urinate at least once every 8 hours. °· You develop cold or clammy skin. °· You continue to vomit for longer than 24 to 48 hours. °· You have a fever. °MAKE SURE YOU:  °· Understand these instructions. °· Will watch your condition. °· Will get help right away if you are not doing well or get worse. °Document Released: 03/16/2005 Document Revised: 06/08/2011 Document Reviewed: 08/13/2010 °ExitCare® Patient Information ©2015 [employer]. This information is not intended   to replace advice given to you by your health care provider. Make sure you discuss any questions you have with your health care provider.  Diarrhea Diarrhea is frequent loose and watery  bowel movements. It can cause you to feel weak and dehydrated. Dehydration can cause you to become tired and thirsty, have a dry mouth, and have decreased urination that often is dark yellow. Diarrhea is a sign of another problem, most often an infection that will not last long. In most cases, diarrhea typically lasts 2-3 days. However, it can last longer if it is a sign of something more serious. It is important to treat your diarrhea as directed by your caregive to lessen or prevent future episodes of diarrhea. CAUSES  Some common causes include:  Gastrointestinal infections caused by viruses, bacteria, or parasites.  Food poisoning or food allergies.  Certain medicines, such as antibiotics, chemotherapy, and laxatives.  Artificial sweeteners and fructose.  Digestive disorders. HOME CARE INSTRUCTIONS  Ensure adequate fluid intake (hydration): have 1 cup (8 oz) of fluid for each diarrhea episode. Avoid fluids that contain simple sugars or sports drinks, fruit juices, whole milk products, and sodas. Your urine should be clear or pale yellow if you are drinking enough fluids. Hydrate with an oral rehydration solution that you can purchase at pharmacies, retail stores, and online. You can prepare an oral rehydration solution at home by mixing the following ingredients together:   - tsp table salt.   tsp baking soda.   tsp salt substitute containing potassium chloride.  1  tablespoons sugar.  1 L (34 oz) of water.  Certain foods and beverages may increase the speed at which food moves through the gastrointestinal (GI) tract. These foods and beverages should be avoided and include:  Caffeinated and alcoholic beverages.  High-fiber foods, such as raw fruits and vegetables, nuts, seeds, and whole grain breads and cereals.  Foods and beverages sweetened with sugar alcohols, such as xylitol, sorbitol, and mannitol.  Some foods may be well tolerated and may help thicken stool  including:  Starchy foods, such as rice, toast, pasta, low-sugar cereal, oatmeal, grits, baked potatoes, crackers, and bagels.  Bananas.  Applesauce.  Add probiotic-rich foods to help increase healthy bacteria in the GI tract, such as yogurt and fermented milk products.  Wash your hands well after each diarrhea episode.  Only take over-the-counter or prescription medicines as directed by your caregiver.  Take a warm bath to relieve any burning or pain from frequent diarrhea episodes. SEEK IMMEDIATE MEDICAL CARE IF:   You are unable to keep fluids down.  You have persistent vomiting.  You have blood in your stool, or your stools are black and tarry.  You do not urinate in 6-8 hours, or there is only a small amount of very dark urine.  You have abdominal pain that increases or localizes.  You have weakness, dizziness, confusion, or lightheadedness.  You have a severe headache.  Your diarrhea gets worse or does not get better.  You have a fever or persistent symptoms for more than 2-3 days.  You have a fever and your symptoms suddenly get worse. MAKE SURE YOU:   Understand these instructions.  Will watch your condition.  Will get help right away if you are not doing well or get worse. Document Released: 03/06/2002 Document Revised: 03/02/2012 Document Reviewed: 11/22/2011 [facility] Patient Information 2015 [place], [place]. This information is not intended to replace advice given to you by your health care provider. Make sure you discuss  any questions you have with your health care provider.  Ondansetron tablets What is this medicine? ONDANSETRON (on DAN se tron) is used to treat nausea and vomiting caused by chemotherapy. It is also used to prevent or treat nausea and vomiting after surgery. This medicine may be used for other purposes; ask your health care provider or pharmacist if you have questions. COMMON BRAND NAME(S): Zofran What should I tell my health care  provider before I take this medicine? They need to know if you have any of these conditions: -heart disease -history of irregular heartbeat -liver disease -low levels of magnesium or potassium in the blood -an unusual or allergic reaction to ondansetron, granisetron, other medicines, foods, dyes, or preservatives -pregnant or trying to get pregnant -breast-feeding How should I use this medicine? Take this medicine by mouth with a glass of water. Follow the directions on your prescription label. Take your doses at regular intervals. Do not take your medicine more often than directed. Talk to your pediatrician regarding the use of this medicine in children. Special care may be needed. Overdosage: If you think you have taken too much of this medicine contact a poison control center or emergency room at once. NOTE: This medicine is only for you. Do not share this medicine with others. What if I miss a dose? If you miss a dose, take it as soon as you can. If it is almost time for your next dose, take only that dose. Do not take double or extra doses. What may interact with this medicine? Do not take this medicine with any of the following medications: -apomorphine -certain medicines for fungal infections like fluconazole, itraconazole, ketoconazole, posaconazole, voriconazole -cisapride -dofetilide -dronedarone -pimozide -thioridazine -ziprasidone This medicine may also interact with the following medications: -carbamazepine -certain medicines for depression, anxiety, or psychotic disturbances -fentanyl -linezolid -MAOIs like Carbex, Eldepryl, Marplan, Nardil, and Parnate -methylene blue (injected into a vein) -other medicines that prolong the QT interval (cause an abnormal heart rhythm) -phenytoin -rifampicin -tramadol This list may not describe all possible interactions. Give your health care provider a list of all the medicines, herbs, non-prescription drugs, or dietary supplements  you use. Also tell them if you smoke, drink alcohol, or use illegal drugs. Some items may interact with your medicine. What should I watch for while using this medicine? Check with your doctor or health care professional right away if you have any sign of an allergic reaction. What side effects may I notice from receiving this medicine? Side effects that you should report to your doctor or health care professional as soon as possible: -allergic reactions like skin rash, itching or hives, swelling of the face, lips or tongue -breathing problems -confusion -dizziness -fast or irregular heartbeat -feeling faint or lightheaded, falls -fever and chills -loss of balance or coordination -seizures -sweating -swelling of the hands or feet -tightness in the chest -tremors -unusually weak or tired Side effects that usually do not require medical attention (report to your doctor or health care professional if they continue or are bothersome): -constipation or diarrhea -headache This list may not describe all possible side effects. Call your doctor for medical advice about side effects. You may report side effects to FDA at 1-800-FDA-1088. Where should I keep my medicine? Keep out of the reach of children. Store between 2 and 30 degrees C (36 and 86 degrees F). Throw away any unused medicine after the expiration date. NOTE: This sheet is a summary. It may not cover all possible information. If  you have questions about this medicine, talk to your doctor, pharmacist, or health care provider.  2015, Elsevier/Gold Standard. (2012-12-21 16:27:45)

## 2013-10-15 NOTE — ED Provider Notes (Signed)
CSN: [id number]     Arrival date & time 10/15/13  0225 History   First MD Initiated Contact with Patient 10/15/13 [phone]     Chief Complaint  Patient presents with  . Emesis  . Diarrhea     (Consider location/radiation/quality/duration/timing/severity/associated sxs/prior Treatment) Patient is a 68 y.o. male presenting with vomiting and diarrhea. The history is provided by the patient.  Emesis Associated symptoms: diarrhea   Diarrhea Associated symptoms: vomiting   He had onset this afternoon of nausea, vomiting, abdominal cramping. He has vomited multiple times and has not been able to hold anything down. He has had watery diarrhea. There's been no blood or mucus in the stool or emesis. He denies fever or chills but has had sweats. Number cramping is mild to moderate and he rates it 4/10. He tried taking a dose of loperamide but vomited after taking it. Because of ongoing vomiting, he decided to come to the ED. He denies any sick contacts.  Past Medical History  Diagnosis Date  . Thyroid disease   . Glaucoma   . Shingles   . Cancer    Past Surgical History  Procedure Laterality Date  . Melonoma     No family history on file. History  Substance Use Topics  . Smoking status: Former Research scientist (life sciences)  . Smokeless tobacco: Not on file  . Alcohol Use: No    Review of Systems  Gastrointestinal: Positive for vomiting and diarrhea.  All other systems reviewed and are negative.     Allergies  Tetracyclines & related  Home Medications   Prior to Admission medications   Medication Sig Start Date End Date Taking? Authorizing Provider  bismuth subsalicylate (PEPTO BISMOL) 262 MG/15ML suspension Take 30 mLs by mouth every 6 (six) hours as needed.   Yes Historical Provider, MD  esomeprazole (NEXIUM) 20 MG capsule Take 20 mg by mouth daily at 12 noon.   Yes Historical Provider, MD  ibuprofen (ADVIL,MOTRIN) 800 MG tablet Take 800 mg by mouth every 8 (eight) hours as needed.   Yes Historical  Provider, MD  levothyroxine (SYNTHROID, LEVOTHROID) 125 MCG tablet Take 125 mcg by mouth daily before breakfast.   Yes Historical Provider, MD  saw palmetto 160 MG capsule Take 160 mg by mouth 2 (two) times daily.   Yes Historical Provider, MD  ondansetron (ZOFRAN ODT) 4 MG disintegrating tablet Take 1 tablet (4 mg total) by mouth every 8 (eight) hours as needed for nausea. 08/14/12   Prentiss Bells, MD   BP 148/90  Pulse 89  Temp(Src) 98.1 F (36.7 C) (Oral)  Resp 22  Ht 5\' 10"  (1.778 m)  Wt 185 lb (83.915 kg)  BMI 26.54 kg/m2  SpO2 100% Physical Exam  Nursing note and vitals reviewed.  68 year old male, resting comfortably and in no acute distress. Vital signs are significant for hypertension with blood pressure 140/90, and tachypnea with respiratory rate of 22. Oxygen saturation is 100%, which is normal. Head is normocephalic and atraumatic. PERRLA, EOMI. Oropharynx is clear. Neck is nontender and supple without adenopathy or JVD. Back is nontender and there is no CVA tenderness. Lungs are clear without rales, wheezes, or rhonchi. Chest is nontender. Heart has regular rate and rhythm without murmur. Abdomen is soft, flat, nontender without masses or hepatosplenomegaly and peristalsis is hypoactive. Extremities have no cyanosis or edema, full range of motion is present. Skin is warm and dry without rash. Neurologic: Mental status is normal, cranial nerves are intact, there are no motor  or sensory deficits.  ED Course  Procedures (including critical care time) Labs Review Results for orders placed during the hospital encounter of 10/15/13  CBC WITH DIFFERENTIAL      Result Value Ref Range   WBC 10.3  4.0 - 10.5 K/uL   RBC 4.58  4.22 - 5.81 MIL/uL   Hemoglobin 15.1  13.0 - 17.0 g/dL   HCT 42.9  39.0 - 52.0 %   MCV 93.7  78.0 - 100.0 fL   MCH 33.0  26.0 - 34.0 pg   MCHC 35.2  30.0 - 36.0 g/dL   RDW 12.8  11.5 - 15.5 %   Platelets 236  150 - 400 K/uL   Neutrophils Relative %  90 (*) 43 - 77 %   Neutro Abs 9.3 (*) 1.7 - 7.7 K/uL   Lymphocytes Relative 5 (*) 12 - 46 %   Lymphs Abs 0.5 (*) 0.7 - 4.0 K/uL   Monocytes Relative 5  3 - 12 %   Monocytes Absolute 0.6  0.1 - 1.0 K/uL   Eosinophils Relative 0  0 - 5 %   Eosinophils Absolute 0.0  0.0 - 0.7 K/uL   Basophils Relative 0  0 - 1 %   Basophils Absolute 0.0  0.0 - 0.1 K/uL  BASIC METABOLIC PANEL      Result Value Ref Range   Sodium 139  137 - 147 mEq/L   Potassium 4.8  3.7 - 5.3 mEq/L   Chloride 104  96 - 112 mEq/L   CO2 24  19 - 32 mEq/L   Glucose, Bld 117 (*) 70 - 99 mg/dL   BUN 19  6 - 23 mg/dL   Creatinine, Ser 0.83  0.50 - 1.35 mg/dL   Calcium 8.9  8.4 - 10.5 mg/dL   GFR calc non Af Amer 87 (*) >90 mL/min   GFR calc Af Amer >90  >90 mL/min   Anion gap 11  5 - 15    MDM   Final diagnoses:  Nausea vomiting and diarrhea    Vomiting and diarrhea no pattern most consistent with viral gastroenteritis. He sees his diarrhea seems to be slowing she may have actually absorbed some of his loperamide to vomiting it up. He is given IV hydration and IV ondansetron. With that, nausea is improved. He will be given a trial of oral fluids.  He tolerated oral fluids well. He is discharged with prescription for ondansetron and has to use over-the-counter loperamide as needed to control diarrhea.  Delora Fuel, MD 18/29/93 [id number]

## 2013-10-15 NOTE — ED Notes (Signed)
Pt given gingerale for fluid challenge. 

## 2013-10-15 NOTE — ED Notes (Signed)
Pt reports taking two Imodium at 22:30 with no relief from diarrhea.

## 2013-10-15 NOTE — ED Notes (Signed)
Pt c/o abdominal pain with emesis and diarrhea today.

## 2013-10-16 MED FILL — Ondansetron HCl Tab 4 MG: ORAL | Qty: 4 | Status: AC

## 2013-10-24 DIAGNOSIS — M6281 Muscle weakness (generalized): Secondary | ICD-10-CM | POA: Diagnosis not present

## 2013-10-24 DIAGNOSIS — M545 Low back pain, unspecified: Secondary | ICD-10-CM | POA: Diagnosis not present

## 2013-10-24 DIAGNOSIS — IMO0002 Reserved for concepts with insufficient information to code with codable children: Secondary | ICD-10-CM | POA: Diagnosis not present

## 2013-10-27 DIAGNOSIS — IMO0002 Reserved for concepts with insufficient information to code with codable children: Secondary | ICD-10-CM | POA: Diagnosis not present

## 2013-10-27 DIAGNOSIS — M6281 Muscle weakness (generalized): Secondary | ICD-10-CM | POA: Diagnosis not present

## 2013-10-27 DIAGNOSIS — M545 Low back pain, unspecified: Secondary | ICD-10-CM | POA: Diagnosis not present

## 2013-10-31 DIAGNOSIS — M545 Low back pain, unspecified: Secondary | ICD-10-CM | POA: Diagnosis not present

## 2013-10-31 DIAGNOSIS — M6281 Muscle weakness (generalized): Secondary | ICD-10-CM | POA: Diagnosis not present

## 2013-10-31 DIAGNOSIS — IMO0002 Reserved for concepts with insufficient information to code with codable children: Secondary | ICD-10-CM | POA: Diagnosis not present

## 2013-11-02 DIAGNOSIS — M545 Low back pain, unspecified: Secondary | ICD-10-CM | POA: Diagnosis not present

## 2013-11-02 DIAGNOSIS — M6281 Muscle weakness (generalized): Secondary | ICD-10-CM | POA: Diagnosis not present

## 2013-11-02 DIAGNOSIS — IMO0002 Reserved for concepts with insufficient information to code with codable children: Secondary | ICD-10-CM | POA: Diagnosis not present

## 2013-11-03 DIAGNOSIS — M6281 Muscle weakness (generalized): Secondary | ICD-10-CM | POA: Diagnosis not present

## 2013-11-03 DIAGNOSIS — M545 Low back pain, unspecified: Secondary | ICD-10-CM | POA: Diagnosis not present

## 2013-11-03 DIAGNOSIS — IMO0002 Reserved for concepts with insufficient information to code with codable children: Secondary | ICD-10-CM | POA: Diagnosis not present

## 2013-11-07 DIAGNOSIS — M545 Low back pain, unspecified: Secondary | ICD-10-CM | POA: Diagnosis not present

## 2013-11-07 DIAGNOSIS — IMO0002 Reserved for concepts with insufficient information to code with codable children: Secondary | ICD-10-CM | POA: Diagnosis not present

## 2013-11-07 DIAGNOSIS — M6281 Muscle weakness (generalized): Secondary | ICD-10-CM | POA: Diagnosis not present

## 2013-11-08 DIAGNOSIS — IMO0002 Reserved for concepts with insufficient information to code with codable children: Secondary | ICD-10-CM | POA: Diagnosis not present

## 2013-11-08 DIAGNOSIS — M545 Low back pain, unspecified: Secondary | ICD-10-CM | POA: Diagnosis not present

## 2013-11-08 DIAGNOSIS — M6281 Muscle weakness (generalized): Secondary | ICD-10-CM | POA: Diagnosis not present

## 2013-11-09 DIAGNOSIS — M545 Low back pain, unspecified: Secondary | ICD-10-CM | POA: Diagnosis not present

## 2013-11-09 DIAGNOSIS — M6281 Muscle weakness (generalized): Secondary | ICD-10-CM | POA: Diagnosis not present

## 2013-11-09 DIAGNOSIS — IMO0002 Reserved for concepts with insufficient information to code with codable children: Secondary | ICD-10-CM | POA: Diagnosis not present

## 2013-11-13 DIAGNOSIS — IMO0002 Reserved for concepts with insufficient information to code with codable children: Secondary | ICD-10-CM | POA: Diagnosis not present

## 2013-11-13 DIAGNOSIS — M6281 Muscle weakness (generalized): Secondary | ICD-10-CM | POA: Diagnosis not present

## 2013-11-13 DIAGNOSIS — M545 Low back pain, unspecified: Secondary | ICD-10-CM | POA: Diagnosis not present

## 2013-11-14 DIAGNOSIS — IMO0002 Reserved for concepts with insufficient information to code with codable children: Secondary | ICD-10-CM | POA: Diagnosis not present

## 2013-11-14 DIAGNOSIS — M545 Low back pain, unspecified: Secondary | ICD-10-CM | POA: Diagnosis not present

## 2013-11-14 DIAGNOSIS — M6281 Muscle weakness (generalized): Secondary | ICD-10-CM | POA: Diagnosis not present

## 2013-11-15 DIAGNOSIS — M6281 Muscle weakness (generalized): Secondary | ICD-10-CM | POA: Diagnosis not present

## 2013-11-15 DIAGNOSIS — M545 Low back pain, unspecified: Secondary | ICD-10-CM | POA: Diagnosis not present

## 2013-11-15 DIAGNOSIS — IMO0002 Reserved for concepts with insufficient information to code with codable children: Secondary | ICD-10-CM | POA: Diagnosis not present

## 2013-11-20 DIAGNOSIS — IMO0002 Reserved for concepts with insufficient information to code with codable children: Secondary | ICD-10-CM | POA: Diagnosis not present

## 2013-11-20 DIAGNOSIS — M6281 Muscle weakness (generalized): Secondary | ICD-10-CM | POA: Diagnosis not present

## 2013-11-20 DIAGNOSIS — M545 Low back pain, unspecified: Secondary | ICD-10-CM | POA: Diagnosis not present

## 2013-11-21 ENCOUNTER — Other Ambulatory Visit (INDEPENDENT_AMBULATORY_CARE_PROVIDER_SITE_OTHER): Payer: Self-pay | Admitting: *Deleted

## 2013-11-21 ENCOUNTER — Encounter (INDEPENDENT_AMBULATORY_CARE_PROVIDER_SITE_OTHER): Payer: Self-pay | Admitting: *Deleted

## 2013-11-21 ENCOUNTER — Encounter (INDEPENDENT_AMBULATORY_CARE_PROVIDER_SITE_OTHER): Payer: Self-pay | Admitting: Internal Medicine

## 2013-11-21 ENCOUNTER — Ambulatory Visit (INDEPENDENT_AMBULATORY_CARE_PROVIDER_SITE_OTHER): Payer: Medicare Other | Admitting: Internal Medicine

## 2013-11-21 VITALS — BP 130/80 | HR 64 | Temp 97.3°F | Ht 70.0 in | Wt 183.7 lb

## 2013-11-21 DIAGNOSIS — K219 Gastro-esophageal reflux disease without esophagitis: Secondary | ICD-10-CM | POA: Diagnosis not present

## 2013-11-21 DIAGNOSIS — H409 Unspecified glaucoma: Secondary | ICD-10-CM | POA: Insufficient documentation

## 2013-11-21 DIAGNOSIS — E039 Hypothyroidism, unspecified: Secondary | ICD-10-CM | POA: Insufficient documentation

## 2013-11-21 MED ORDER — PANTOPRAZOLE SODIUM 40 MG PO TBEC: 40.0000 mg | DELAYED_RELEASE_TABLET | Freq: Every day | ORAL | Status: DC

## 2013-11-21 NOTE — Progress Notes (Signed)
Subjective:     Patient ID: Darryl Phillips, male   DOB: 05-11-1945, 68 y.o.   MRN: [id number]  HPI Referred to our office by Dr. Hilma Favors for GERD. He tells me he has been having a lot of gas and nausea. He has nausea daily.  During the night he has to get up to void. He tells me his "stomach" rolls around and he will take [place]. He has had some bout of constipation and diarrhea on occasion. He has diarrhea maybe once a week.  He says he is lactose intolerant and does avoid milk products.  He says the Nexium has helped. Takes Nexium daily at night. Uses OTC. He feels bloated frequently (daily). He does a lot of burping and does have a lot of flatus.  His appetite is good. Weight loss 20 pounds this summer which he says was not intentional. He has usually 2 BMs daily. No melena or BRRB.  He is avoiding citrus foods and milk.   11/15/2007 Colonoscopy: (Non bloody diarrhea) Normal terminal ileum. Few small diverticula at sigmoid colon. External hemorrhoids.    Review of Systems Past Medical History  Diagnosis Date  . Thyroid disease   . Glaucoma   . Shingles   . Cancer     Melanoma back 2009    Past Surgical History  Procedure Laterality Date  . Melonoma      x 4 sugeries  . Cholecystectomy      Dr. Arnoldo Morale    Allergies  Allergen Reactions  . Tetracyclines & Related     Current Outpatient Prescriptions on File Prior to Visit  Medication Sig Dispense Refill  . bismuth subsalicylate (PEPTO BISMOL) 262 MG/15ML suspension Take 30 mLs by mouth every 6 (six) hours as needed.      Marland Kitchen esomeprazole (NEXIUM) 20 MG capsule Take 20 mg by mouth daily at 12 noon.      Marland Kitchen ibuprofen (ADVIL,MOTRIN) 800 MG tablet Take 800 mg by mouth 2 (two) times daily.       Marland Kitchen levothyroxine (SYNTHROID, LEVOTHROID) 125 MCG tablet Take 125 mcg by mouth daily before breakfast.      . saw palmetto 160 MG capsule Take 160 mg by mouth 2 (two) times daily.       No current facility-administered medications on  file prior to visit.        Objective:   Physical Exam  Filed Vitals:   11/21/13 0958  BP: 130/80  Pulse: 64  Temp: 97.3 F (36.3 C)  Height: 5\' 10"  (1.778 m)  Weight: 183 lb 11.2 oz (83.326 kg)   Alert and oriented. Skin warm and dry. Oral mucosa is moist.   . Sclera anicteric, conjunctivae is pink. Thyroid not enlarged. No cervical lymphadenopathy. Lungs clear. Heart regular rate and rhythm.  Abdomen is soft. Bowel sounds are positive. No hepatomegaly. No abdominal masses felt. No tenderness.  No edema to lower extremities.      Assessment:    GERD not controlled at this time. Patient taking Motrin 800mg  BID. He does take the Motrin with food.     Plan:    EGD. The risks and benefits such as perforation, bleeding, and infection were reviewed with the patient and is agreeable.

## 2013-11-21 NOTE — Patient Instructions (Signed)
EGD. The risks and benefits such as perforation, bleeding, and infection were reviewed with the patient and is agreeable. 

## 2013-11-22 DIAGNOSIS — M545 Low back pain, unspecified: Secondary | ICD-10-CM | POA: Diagnosis not present

## 2013-11-22 DIAGNOSIS — M6281 Muscle weakness (generalized): Secondary | ICD-10-CM | POA: Diagnosis not present

## 2013-11-22 DIAGNOSIS — IMO0002 Reserved for concepts with insufficient information to code with codable children: Secondary | ICD-10-CM | POA: Diagnosis not present

## 2013-11-27 DIAGNOSIS — M545 Low back pain, unspecified: Secondary | ICD-10-CM | POA: Diagnosis not present

## 2013-11-27 DIAGNOSIS — M6281 Muscle weakness (generalized): Secondary | ICD-10-CM | POA: Diagnosis not present

## 2013-11-27 DIAGNOSIS — IMO0002 Reserved for concepts with insufficient information to code with codable children: Secondary | ICD-10-CM | POA: Diagnosis not present

## 2013-11-29 ENCOUNTER — Encounter (HOSPITAL_COMMUNITY): Payer: Self-pay | Admitting: Pharmacy Technician

## 2013-12-12 IMAGING — CR DG ABDOMEN ACUTE W/ 1V CHEST
3 series · 3 of 3 positions shown · non-contrast
Comparison: 10/10/07

CLINICAL DATA: Vomiting and diarrhea

ACUTE ABDOMEN SERIES (ABDOMEN 2 VIEW & CHEST 1 VIEW)

[view not recorded (1 of 3)]
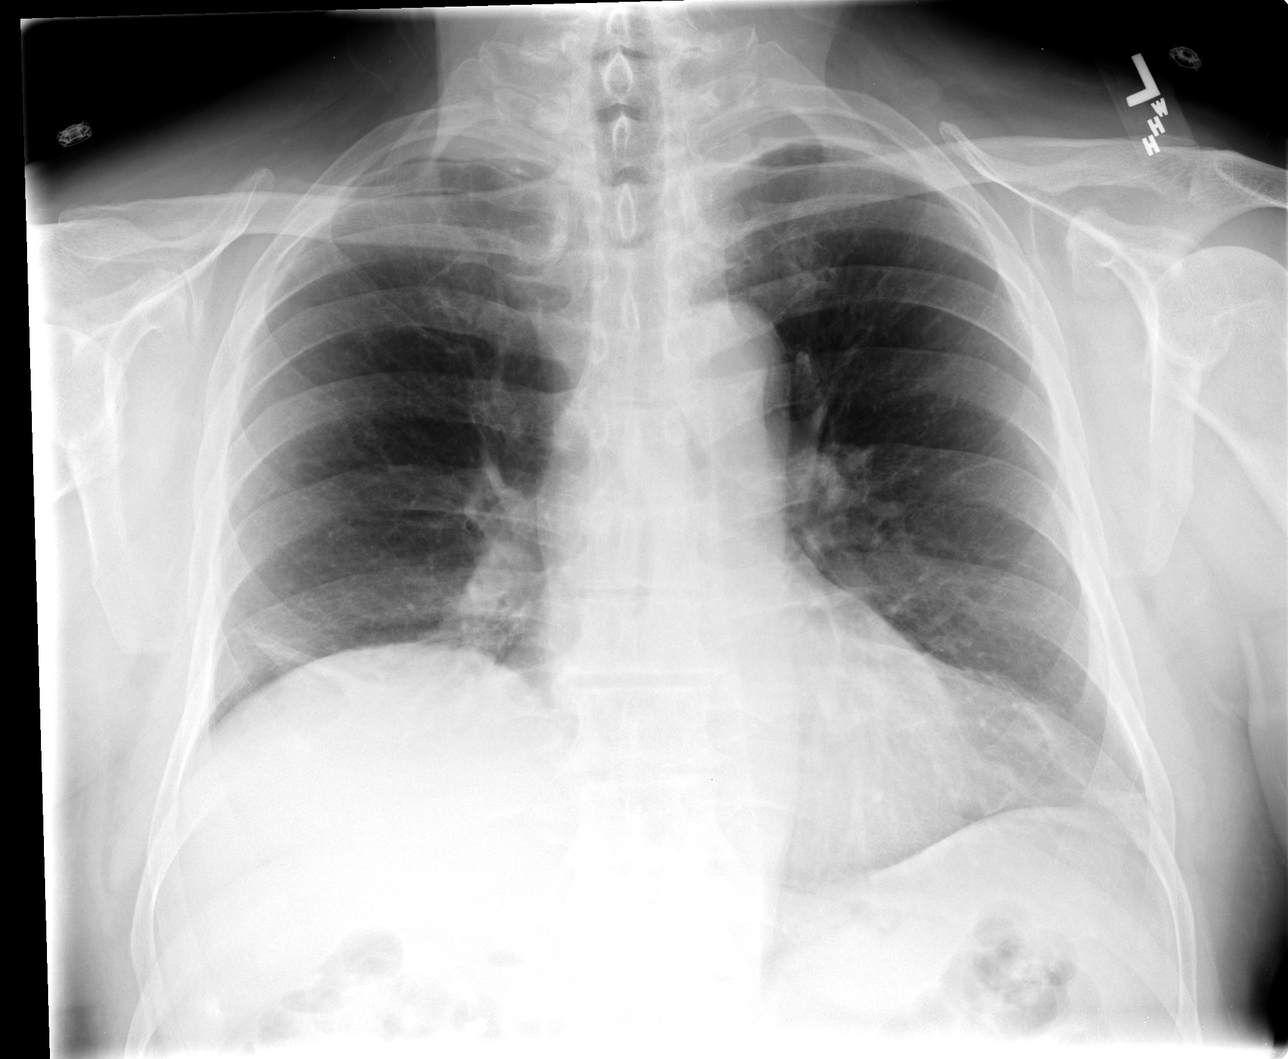

[view not recorded (2 of 3)]
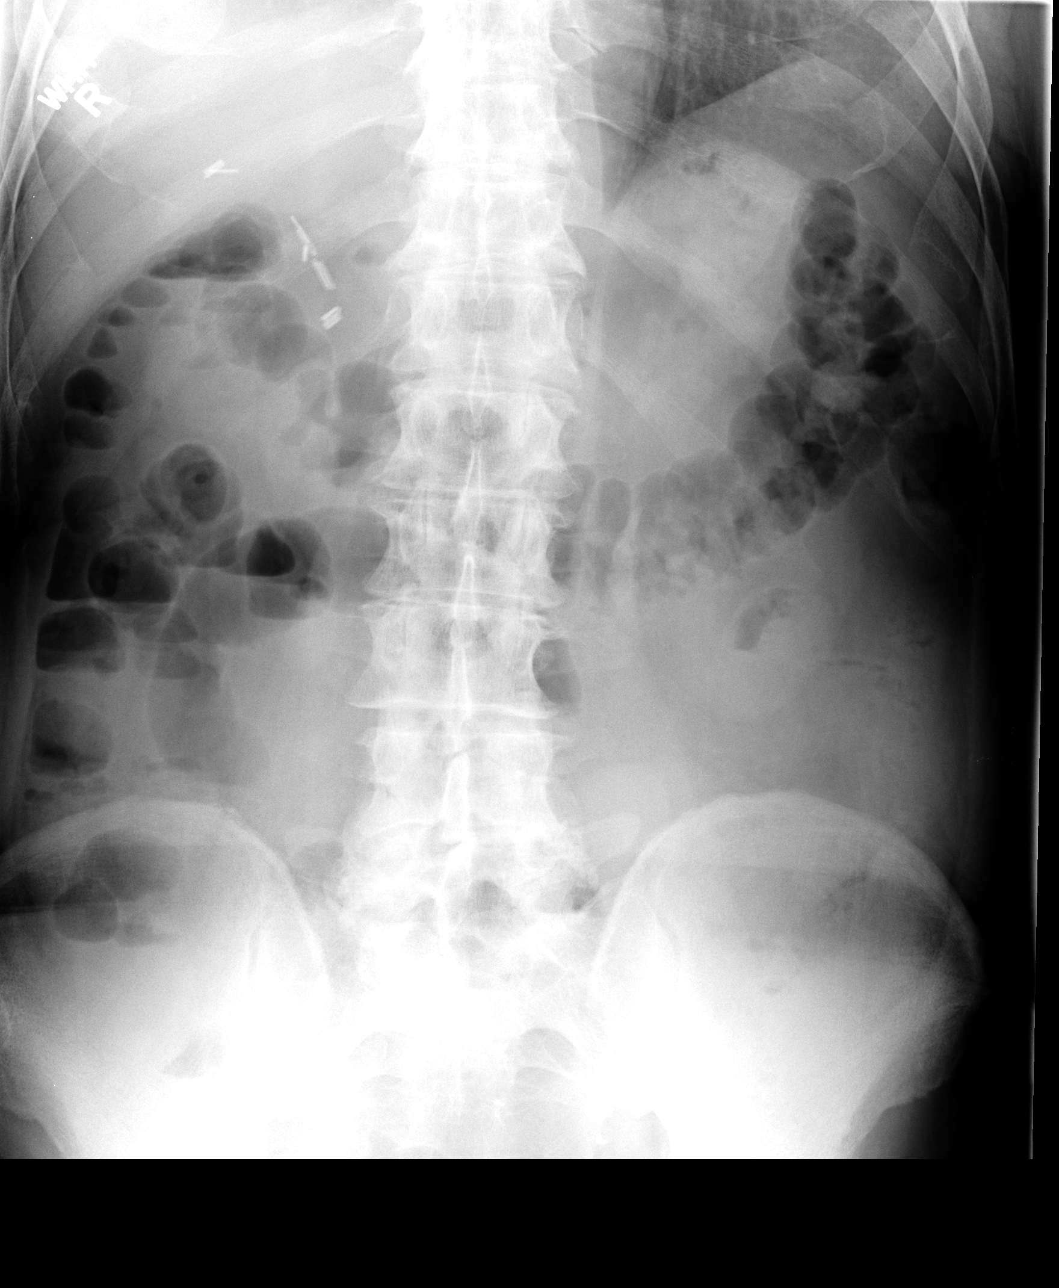

[view not recorded (3 of 3)]
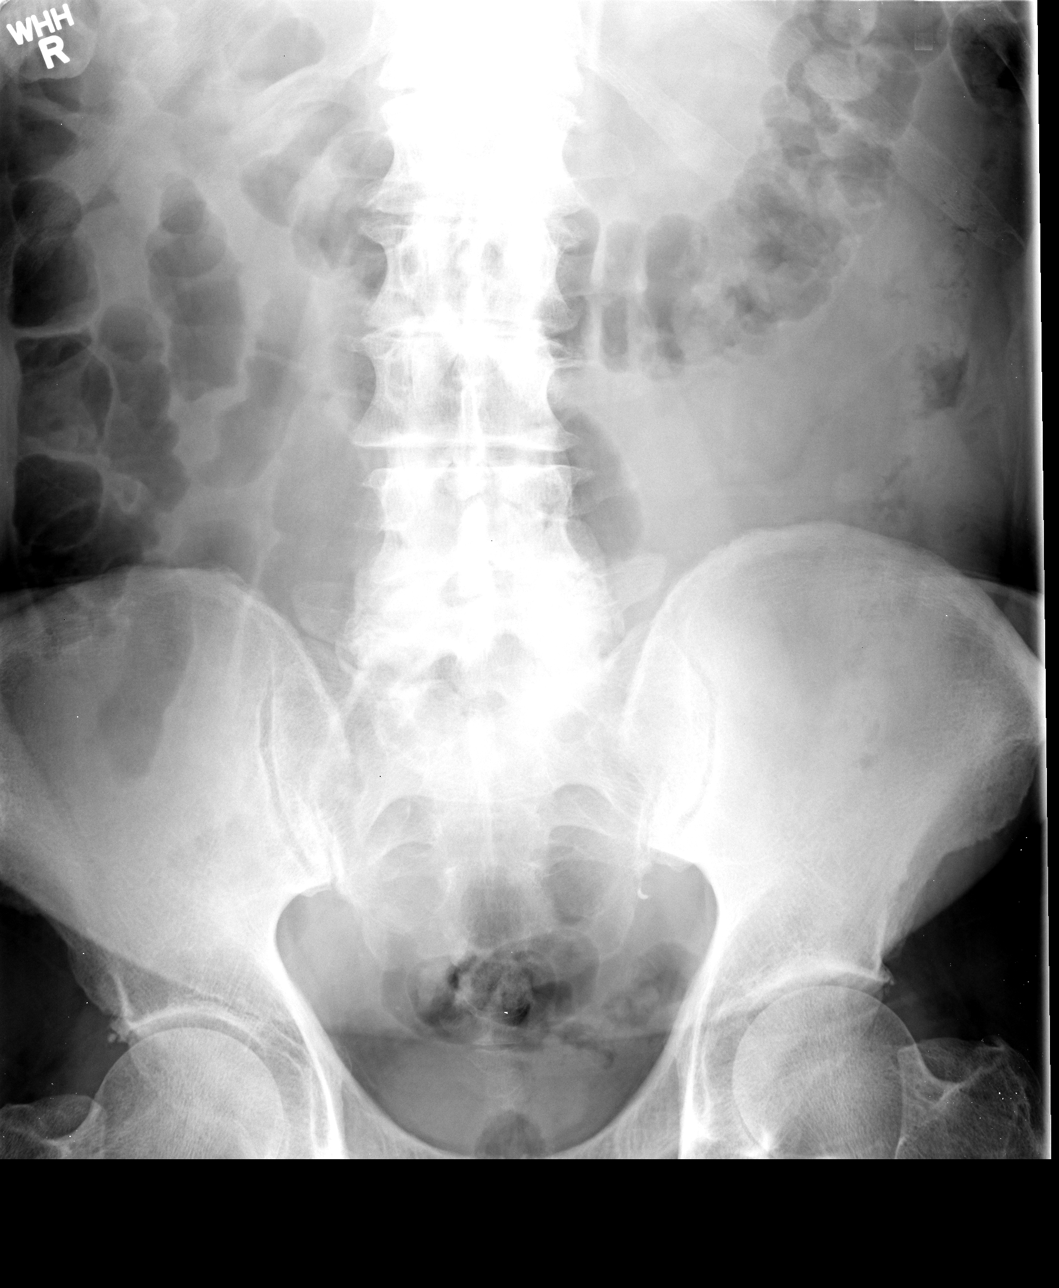

[3 of 3 positions shown; findings below may reference images not displayed]

FINDINGS: Heart size is normal.  No pleural effusion or edema.
Atelectasis is noted in both lung bases.  There is asymmetric
elevation of the right hemidiaphragm.  Cholecystectomy clips are
present in the right upper quadrant of the abdomen.  No abnormally
dilated loops of small bowel identified.  No small bowel air-fluid
levels identified.  There is gas and stool noted within the colon
up to the rectum.

Mild multilevel spondylosis identified within the thoracic spine.
IMPRESSION: 1.  Nonobstructive bowel gas pattern.
2.  Prior cholecystectomy.

## 2013-12-14 ENCOUNTER — Ambulatory Visit (HOSPITAL_COMMUNITY)
Admission: RE | Admit: 2013-12-14 | Discharge: 2013-12-14 | Disposition: A | Discharge status: Home / Self Care | Payer: Medicare Other | Source: Ambulatory Visit | Attending: Internal Medicine | Admitting: Internal Medicine

## 2013-12-14 ENCOUNTER — Encounter (HOSPITAL_COMMUNITY): Payer: Self-pay

## 2013-12-14 ENCOUNTER — Encounter (HOSPITAL_COMMUNITY)
Admission: RE | Disposition: A | Discharge status: Home / Self Care | Payer: Self-pay | Source: Ambulatory Visit | Attending: Internal Medicine

## 2013-12-14 DIAGNOSIS — R634 Abnormal weight loss: Secondary | ICD-10-CM | POA: Diagnosis not present

## 2013-12-14 DIAGNOSIS — R142 Eructation: Secondary | ICD-10-CM

## 2013-12-14 DIAGNOSIS — R110 Nausea: Secondary | ICD-10-CM | POA: Insufficient documentation

## 2013-12-14 DIAGNOSIS — R141 Gas pain: Secondary | ICD-10-CM

## 2013-12-14 DIAGNOSIS — Z87891 Personal history of nicotine dependence: Secondary | ICD-10-CM | POA: Insufficient documentation

## 2013-12-14 DIAGNOSIS — K2990 Gastroduodenitis, unspecified, without bleeding: Secondary | ICD-10-CM | POA: Diagnosis not present

## 2013-12-14 DIAGNOSIS — K2940 Chronic atrophic gastritis without bleeding: Secondary | ICD-10-CM | POA: Diagnosis not present

## 2013-12-14 DIAGNOSIS — R143 Flatulence: Secondary | ICD-10-CM

## 2013-12-14 DIAGNOSIS — Z79899 Other long term (current) drug therapy: Secondary | ICD-10-CM | POA: Insufficient documentation

## 2013-12-14 DIAGNOSIS — E079 Disorder of thyroid, unspecified: Secondary | ICD-10-CM | POA: Insufficient documentation

## 2013-12-14 DIAGNOSIS — Z85820 Personal history of malignant melanoma of skin: Secondary | ICD-10-CM | POA: Diagnosis not present

## 2013-12-14 DIAGNOSIS — R197 Diarrhea, unspecified: Secondary | ICD-10-CM

## 2013-12-14 DIAGNOSIS — D1330 Benign neoplasm of unspecified part of small intestine: Secondary | ICD-10-CM | POA: Diagnosis not present

## 2013-12-14 DIAGNOSIS — K2970 Gastritis, unspecified, without bleeding: Secondary | ICD-10-CM | POA: Diagnosis not present

## 2013-12-14 DIAGNOSIS — K219 Gastro-esophageal reflux disease without esophagitis: Secondary | ICD-10-CM

## 2013-12-14 HISTORY — PX: ESOPHAGOGASTRODUODENOSCOPY: SHX5428

## 2013-12-14 SURGERY — EGD (ESOPHAGOGASTRODUODENOSCOPY)
Anesthesia: Moderate Sedation

## 2013-12-14 MED ORDER — MEPERIDINE HCL 50 MG/ML IJ SOLN
INTRAMUSCULAR | Status: DC | PRN
  Administered 2013-12-14 (×2): 25 mg via INTRAVENOUS

## 2013-12-14 MED ORDER — MIDAZOLAM HCL 5 MG/5ML IJ SOLN
INTRAMUSCULAR | Status: DC | PRN
  Administered 2013-12-14 (×4): 2 mg via INTRAVENOUS

## 2013-12-14 MED ORDER — STERILE WATER FOR IRRIGATION IR SOLN
Status: DC | PRN
  Administered 2013-12-14: 15:00:00

## 2013-12-14 MED ORDER — METRONIDAZOLE 250 MG PO TABS: 250.0000 mg | ORAL_TABLET | Freq: Three times a day (TID) | ORAL | Status: DC

## 2013-12-14 MED ORDER — BUTAMBEN-TETRACAINE-BENZOCAINE 2-2-14 % EX AERO
INHALATION_SPRAY | CUTANEOUS | Status: DC | PRN
  Administered 2013-12-14: 2 via TOPICAL

## 2013-12-14 MED ORDER — MEPERIDINE HCL 50 MG/ML IJ SOLN
INTRAMUSCULAR | Status: AC
  Filled 2013-12-14: qty 1

## 2013-12-14 MED ORDER — MIDAZOLAM HCL 5 MG/5ML IJ SOLN
INTRAMUSCULAR | Status: AC
  Filled 2013-12-14: qty 10

## 2013-12-14 MED ORDER — SODIUM CHLORIDE 0.9 % IV SOLN
INTRAVENOUS | Status: DC
Start: 2013-12-14 — End: 2013-12-14
  Administered 2013-12-14: 14:00:00 via INTRAVENOUS

## 2013-12-14 NOTE — Discharge Instructions (Addendum)
Resume usual medications but discontinue ibuprofen. Plan take Tylenol as needed up to 2 g per day(500 mg 3-4 times a day as needed). Resume usual diet. Metronidazole 250 mg by mouth after each meal for 2 weeks. No driving for 24 hours. Physician will call with biopsy results.   Esophagogastroduodenoscopy Care After Refer to this sheet in the next few weeks. These instructions provide you with information on caring for yourself after your procedure. Your caregiver may also give you more specific instructions. Your treatment has been planned according to current medical practices, but problems sometimes occur. Call your caregiver if you have any problems or questions after your procedure.  HOME CARE INSTRUCTIONS  Do not eat or drink anything until the numbing medicine (local anesthetic) has worn off and your gag reflex has returned. You will know that the local anesthetic has worn off when you can swallow comfortably.  Do not drive for 12 hours after the procedure or as directed by your caregiver.  Only take medicines as directed by your caregiver. SEEK MEDICAL CARE IF:   You cannot stop coughing.  You are not urinating at all or less than usual. SEEK IMMEDIATE MEDICAL CARE IF:  You have difficulty swallowing.  You cannot eat or drink.  You have worsening throat or chest pain.  You have dizziness, lightheadedness, or you faint.  You have nausea or vomiting.  You have chills.  You have a fever.  You have severe abdominal pain.  You have black, tarry, or bloody stools. Document Released: 03/02/2012 Document Reviewed: 03/02/2012 [facility] Patient Information 2015 [place]. This information is not intended to replace advice given to you by your health care provider. Make sure you discuss any questions you have with your health care provider.    Follow up with Dr. Hilma Favors in the am for elevated blood pressure.

## 2013-12-14 NOTE — H&P (Signed)
Darryl Phillips is an 68 y.o. male.   Chief Complaint: Patient is here for EGD. HPI: Patient is 68 year old Caucasian male who presents with for a 6 week history of nausea bloating and frequent burping and passing flatus. He also has noted loose stools for the same duration. He has lost 20 pounds even though he's a good appetite. He also has noted pyrosis. He denies vomiting melena or rectal bleeding. He has been on ibuprofen 800 mg twice daily for several months and now is trying to wean himself off. He denies heartburn or dysphagia. There is no history of peptic ulcer disease. He has history of malignant melanoma which was initially diagnosed in 2009. Is being followed closely at [facility] and remains in remission. He had normal CBC and comprehensive chemistry panel of 3 months ago(reviewed via care everywhere)  Past Medical History  Diagnosis Date  . Thyroid disease   . Glaucoma   . Shingles   . Cancer     Melanoma back 2009    Past Surgical History  Procedure Laterality Date  . Melonoma      x 4 sugeries  . Cholecystectomy      Dr. Arnoldo Morale    History reviewed. No pertinent family history. Social History:  reports that he has quit smoking. He does not have any smokeless tobacco history on file. He reports that he does not drink alcohol or use illicit drugs.  Allergies:  Allergies  Allergen Reactions  . Tetracyclines & Related     Medications Prior to Admission  Medication Sig Dispense Refill  . bismuth subsalicylate (PEPTO BISMOL) 262 MG/15ML suspension Take 30 mLs by mouth every 6 (six) hours as needed.      . brimonidine-timolol (COMBIGAN) 0.2-0.5 % ophthalmic solution Place 1 drop into both eyes every 12 (twelve) hours.      Marland Kitchen esomeprazole (NEXIUM) 20 MG capsule Take 20 mg by mouth daily at 12 noon.      Marland Kitchen ibuprofen (ADVIL,MOTRIN) 800 MG tablet Take 800 mg by mouth 2 (two) times daily.       Marland Kitchen levothyroxine (SYNTHROID, LEVOTHROID) 125 MCG tablet Take 125 mcg by mouth daily  before breakfast.      . pantoprazole (PROTONIX) 40 MG tablet Take 1 tablet (40 mg total) by mouth daily.  30 tablet  3  . saw palmetto 160 MG capsule Take 160 mg by mouth 2 (two) times daily.      . travoprost, benzalkonium, (TRAVATAN) 0.004 % ophthalmic solution Place 1 drop into both eyes at bedtime.        No results found for this or any previous visit (from the past 48 hour(s)). No results found.  ROS  Blood pressure 163/99, pulse 67, temperature 98.2 F (36.8 C), temperature source Oral, height 5\' 10"  (1.778 m), weight 180 lb (81.647 kg), SpO2 96.00%. Physical Exam  Constitutional: He appears well-developed and well-nourished.  HENT:  Mouth/Throat: Oropharynx is clear and moist.  Eyes: Conjunctivae are normal. No scleral icterus.  Neck: No thyromegaly present.  Cardiovascular: Normal rate, regular rhythm and normal heart sounds.   No murmur heard. Respiratory: Effort normal and breath sounds normal.  GI: Soft. He exhibits no distension and no mass. There is no tenderness.  Musculoskeletal: He exhibits no edema.  Lymphadenopathy:    He has no cervical adenopathy.  Neurological: He is alert.  Skin: Skin is warm and dry.     Assessment/Plan; Nausea weight loss bloating and diarrhea. NSAID use. Diagnostic EGD.  Darryl Phillips U  12/14/2013, 2:59 PM

## 2013-12-14 NOTE — Op Note (Signed)
EGD PROCEDURE REPORT  PATIENT:  Darryl Phillips  MR#:  [id number] Birthdate:  April 27, 1945, 67 y.o., male Endoscopist:  Dr. Rogene Houston, MD Referred By:  Dr. Purvis Kilts, MD  Procedure Date: 12/14/2013  Procedure:   EGD  Indications:  Patient is 68 year old Caucasian male with history of malignant melanoma in remission who presents with 4-6 week history of nausea bloating and burping and excessive flatus as well as diarrhea and 20 pound weight loss. She is on ibuprofen. There is no history of recent antibiotic use or travel abroad.             Informed Consent:  The risks, benefits, alternatives & imponderables which include, but are not limited to, bleeding, infection, perforation, drug reaction and potential missed lesion have been reviewed.  The potential for biopsy, lesion removal, esophageal dilation, etc. have also been discussed.  Questions have been answered.  All parties agreeable.  Please see history & physical in medical record for more information.  Medications:  Demerol 50 mg IV Versed 8 mg IV Cetacaine spray topically for oropharyngeal anesthesia  Description of procedure:  The endoscope was introduced through the mouth and advanced to the second portion of the duodenum without difficulty or limitations. The mucosal surfaces were surveyed very carefully during advancement of the scope and upon withdrawal.  Findings:  Esophagus:  Mucosa of the esophagus was normal. GE junction was maybe otherwise unremarkable. GEJ:  40 cm Stomach:  Stomach was empty and distended very well with insufflation. Folds in the proximal stomach were normal. Examination of mucosa at gastric body was normal. Patchy and linear erythema noted in the antrum. Pyloric channel was patent. Angularis fundus and cardia were unremarkable. Duodenum:  Normal bulbar and post bulbar mucosa.  Therapeutic/Diagnostic Maneuvers Performed:   Random biopsies taken from post bulbar duodenum mucosa. Random biopsies  also taken from antral mucosa for routine histology.  Complications:  None  Impression: Nonerosive antral gastritis otherwise normal EGD. Biopsies taken from antral and post bulbar duodenal mucosa.  Comment; Patient's symptoms are suggestive of small bowel bacterial overgrowth.  Recommendations:  Metronidazole 250 mg by mouth three times a day for two weeks. I would be contacting the patient with results of biopsies and further recommendations  REHMAN,NAJEEB U  12/14/2013  3:35 PM  CC: Dr. Hilma Favors, Betsy Coder, MD & Dr. No ref. provider found

## 2013-12-14 NOTE — Progress Notes (Signed)
Patient and friend was told by Dr. Laural Golden to have his blood pressure checked by Dr. Hilma Favors, Primary Care Doctor since BP was elevated during and after the procedure today. Patient stated that he didn't take his Amlodipine because it makes him tired with his glucoma drops (Beta Blocker). Stated that Dr. Hilma Favors said he could take 1/2 of pill, if needed. No furthers orders were given. Nelda Severe, RN to give discharge instructions to patient and friend.

## 2013-12-15 ENCOUNTER — Encounter (HOSPITAL_COMMUNITY): Payer: Self-pay | Admitting: Internal Medicine

## 2013-12-25 DIAGNOSIS — H4011X0 Primary open-angle glaucoma, stage unspecified: Secondary | ICD-10-CM | POA: Diagnosis not present

## 2014-01-04 ENCOUNTER — Encounter (INDEPENDENT_AMBULATORY_CARE_PROVIDER_SITE_OTHER): Payer: Self-pay | Admitting: *Deleted

## 2014-01-08 ENCOUNTER — Telehealth (INDEPENDENT_AMBULATORY_CARE_PROVIDER_SITE_OTHER): Payer: Self-pay | Admitting: *Deleted

## 2014-01-08 NOTE — Telephone Encounter (Signed)
Weight check = 181.1 lbs Darryl Phillips said is he better but still having a little trouble. His weight at home is 178 to 179 with clothes off and concerned with his weight loss. His return phone number is [phone].

## 2014-01-10 NOTE — Telephone Encounter (Signed)
Patient's weight check on 01/08/14 - 181.1 lbs                                            12/14/13 -180 lbs                                            11/21/13- 183 lbs 11.2 oz   Patient is concerned and feels he is still having trouble. He states that at home without clothes he weighs 178 lbs to 179 lbs.  Call back number is  [phone].

## 2014-01-23 ENCOUNTER — Encounter (INDEPENDENT_AMBULATORY_CARE_PROVIDER_SITE_OTHER): Payer: Self-pay | Admitting: Internal Medicine

## 2014-01-23 ENCOUNTER — Ambulatory Visit (INDEPENDENT_AMBULATORY_CARE_PROVIDER_SITE_OTHER): Payer: Medicare Other | Admitting: Internal Medicine

## 2014-01-23 VITALS — BP 130/74 | HR 66 | Temp 97.5°F | Resp 18 | Ht 70.0 in | Wt 181.5 lb

## 2014-01-23 DIAGNOSIS — K589 Irritable bowel syndrome without diarrhea: Secondary | ICD-10-CM | POA: Diagnosis not present

## 2014-01-23 DIAGNOSIS — Z85820 Personal history of malignant melanoma of skin: Secondary | ICD-10-CM

## 2014-01-23 DIAGNOSIS — K6389 Other specified diseases of intestine: Secondary | ICD-10-CM | POA: Diagnosis not present

## 2014-01-23 NOTE — Telephone Encounter (Signed)
Already addressed with patient. 

## 2014-01-23 NOTE — Patient Instructions (Addendum)
Yogurt with probiotic or probiotic 1 capsule daily. Weight check in 4 weeks.

## 2014-01-23 NOTE — Progress Notes (Signed)
Presenting complaint;  Followup for diarrhea bloating and weight loss.  Database;  Patient is 68 year old Caucasian male who was evaluated last month for 4-6 week history of intractable bloating burping excessive flatus diarrhea and 20 pound weight loss. He also had nausea. He had been using ibuprofen for back pain 800 mg daily. Past history significant for malignant melanoma for which she is followed at [facility] and remains in remission. H&H was normal. He underwent EGD on 12/14/2013 revealing nonerosive antral gastritis otherwise normal EGD. Gastric biopsy revealed moderate gastritis but H. pylori stains are negative. This abnormality was felt to be secondary to ibuprofen. Duodenal biopsy was within normal limits. Patient was advised to use ibuprofen on as needed basis. He was however felt to have small intestinal bacterial overgrowth and started on metronidazole which he took for 2 weeks. He now returns for followup visit.   Subjective:  Patient feels much better. He finished metronidazole over 2 weeks ago. He is not having uncomfortable feeling in his abdomen anymore. He is having 1-2 bowel movements per day. Stools are now formed and urgency is much less. His appetite is good. He states he is changed his eating habits. He is eating healthy foods. He has not taken ibuprofen anymore. He is doing exercises as recommended by his back specialist and he is having less pain. He has lost 2 pounds in the last 2 months. All in all he has lost 22 pounds since his symptoms began. He has had slight nausea but no vomiting. Last TSH earlier this year was within normal range. Patient states he has had "sensitive stomach" all his life. He also feels he has lactose intolerance. Lately he's been drinking almond milk. Patient's last colonoscopy was in August 2009 revealing few diverticula at sigmoid colon. Patient states he would undergo chest and abdominopelvic CT at [facility] in 2 months.   Current  Medications: Outpatient Encounter Prescriptions as of 01/23/2014  Medication Sig  . bismuth subsalicylate (PEPTO BISMOL) 262 MG/15ML suspension Take 30 mLs by mouth every 6 (six) hours as needed.  . brimonidine-timolol (COMBIGAN) 0.2-0.5 % ophthalmic solution Place 1 drop into both eyes every 12 (twelve) hours.  Marland Kitchen levothyroxine (SYNTHROID, LEVOTHROID) 125 MCG tablet Take 125 mcg by mouth daily before breakfast.  . pantoprazole (PROTONIX) 40 MG tablet Take 1 tablet (40 mg total) by mouth daily.  . saw palmetto 160 MG capsule Take 160 mg by mouth 2 (two) times daily.  . travoprost, benzalkonium, (TRAVATAN) 0.004 % ophthalmic solution Place 1 drop into both eyes at bedtime.  . [DISCONTINUED] esomeprazole (NEXIUM) 20 MG capsule Take 20 mg by mouth daily at 12 noon.  . [DISCONTINUED] metroNIDAZOLE (FLAGYL) 250 MG tablet Take 1 tablet (250 mg total) by mouth 3 (three) times daily.     Objective: Blood pressure 130/74, pulse 66, temperature 97.5 F (36.4 C), temperature source Oral, resp. rate 18, height 5\' 10"  (1.778 m), weight 181 lb 8 oz (82.328 kg). Patient is alert and in no acute distress. Conjunctiva is pink. Sclera is nonicteric Oropharyngeal mucosa is normal. No neck masses or thyromegaly noted. Cardiac exam with regular rhythm normal S1 and S2. No murmur or gallop noted. Lungs are clear to auscultation. Abdomen is symmetrical. Bowel sounds are normal. On palpation abdomen is soft and nontender without organomegaly or masses.  No LE edema or clubbing noted.  Labs/studies Results:  Gastric and duodenal biopsy results as above.  Assessment:  #1. Patient's symptom complex is suspicious for small intestinal bacterial overgrowth and he  appears to have had significant symptomatic improvement with two-week course of metronidazole although his symptoms have not completely gone away. If symptoms relapse will proceed with hydrogen breath test. He possibly also has lactose intolerance. #2.  Weight loss. Secondary to above. Weight loss seemed to have leveled off. Will monitor. #3. Irritable bowel syndrome. He was felt to have IBS and he had colonoscopy 6 years ago. Given history of enlarged prostate he is not a candidate for anti-spasmodic therapy.    Plan:  Yogurt probiotic or probiotic one capsule by mouth daily. Weight check in 4 weeks. Will wait for results of abdominopelvic CT to be done at [facility] in December 2015. If symptoms relapse will proceed with hydrogen breath test. Next colonoscopy would be in August 2019.

## 2014-01-24 DIAGNOSIS — Z85820 Personal history of malignant melanoma of skin: Secondary | ICD-10-CM | POA: Insufficient documentation

## 2014-01-24 DIAGNOSIS — K589 Irritable bowel syndrome without diarrhea: Secondary | ICD-10-CM | POA: Insufficient documentation

## 2014-02-13 DIAGNOSIS — J069 Acute upper respiratory infection, unspecified: Secondary | ICD-10-CM | POA: Diagnosis not present

## 2014-02-13 DIAGNOSIS — Z6825 Body mass index (BMI) 25.0-25.9, adult: Secondary | ICD-10-CM | POA: Diagnosis not present

## 2014-02-26 ENCOUNTER — Telehealth (INDEPENDENT_AMBULATORY_CARE_PROVIDER_SITE_OTHER): Payer: Self-pay | Admitting: *Deleted

## 2014-02-26 NOTE — Telephone Encounter (Signed)
Weight Check 178.3 Please let Dr. Laural Golden know she is still having diarrhea and nausea. He is getting over a sinus infection and has not had much of an petite. His return phone number is [phone].

## 2014-02-26 NOTE — Telephone Encounter (Signed)
Patient weighed 181 lbs 8 oz at the time of his office visit 01/23/14. Today's visit 178 lbs 3 oz. Still having diarrhea and nausea. Do you want to precede with Hydrogen Breath Test? Patient feels that his weight loss is related to having had sinus infection and no appetite.  Do you want to precede with Hydrogen Breath Test?

## 2014-03-01 NOTE — Telephone Encounter (Signed)
Talked with patient. Will wait for results of testing CT that he is to have at [facility] before any tests ordered Ration will bring [place] a copy of blood work.

## 2014-03-09 DIAGNOSIS — C434 Malignant melanoma of scalp and neck: Secondary | ICD-10-CM | POA: Diagnosis not present

## 2014-03-09 DIAGNOSIS — K5730 Diverticulosis of large intestine without perforation or abscess without bleeding: Secondary | ICD-10-CM | POA: Diagnosis not present

## 2014-03-09 DIAGNOSIS — Z006 Encounter for examination for normal comparison and control in clinical research program: Secondary | ICD-10-CM | POA: Diagnosis not present

## 2014-03-09 DIAGNOSIS — Z85820 Personal history of malignant melanoma of skin: Secondary | ICD-10-CM | POA: Diagnosis not present

## 2014-03-09 DIAGNOSIS — Z483 Aftercare following surgery for neoplasm: Secondary | ICD-10-CM | POA: Diagnosis not present

## 2014-03-12 ENCOUNTER — Telehealth (INDEPENDENT_AMBULATORY_CARE_PROVIDER_SITE_OTHER): Payer: Self-pay | Admitting: *Deleted

## 2014-03-12 NOTE — Telephone Encounter (Signed)
Weight check = 179.0 lbs

## 2014-03-12 NOTE — Telephone Encounter (Signed)
Patient presented to the office today for a weight check.  He weighed 179.0 lbs  Patient weighed 181 lbs 8 oz at the time of his office visit 01/23/14. Today's visit 178 lbs 3 oz. This date was the last weight prior to today's weight.  We have rec'd the information from [place] its on your desk.

## 2014-03-13 DIAGNOSIS — L570 Actinic keratosis: Secondary | ICD-10-CM | POA: Diagnosis not present

## 2014-03-13 DIAGNOSIS — Z85828 Personal history of other malignant neoplasm of skin: Secondary | ICD-10-CM | POA: Diagnosis not present

## 2014-03-15 NOTE — Telephone Encounter (Signed)
Records from [facility] reviewed and patient called. He has had minimal symptoms. He will stop by for weight check in one month.

## 2014-03-15 NOTE — Telephone Encounter (Signed)
Noted  

## 2014-03-16 ENCOUNTER — Encounter (INDEPENDENT_AMBULATORY_CARE_PROVIDER_SITE_OTHER): Payer: Self-pay

## 2014-04-11 ENCOUNTER — Telehealth (INDEPENDENT_AMBULATORY_CARE_PROVIDER_SITE_OTHER): Payer: Self-pay | Admitting: *Deleted

## 2014-04-11 NOTE — Telephone Encounter (Signed)
Weight check 183.2 lbs

## 2014-04-11 NOTE — Telephone Encounter (Signed)
Weight check 04/11/14 183.2 lbs. 03/12/14  179.0 lbs. 01/23/14   181.8 lbs.

## 2014-04-12 ENCOUNTER — Telehealth (INDEPENDENT_AMBULATORY_CARE_PROVIDER_SITE_OTHER): Payer: Self-pay | Admitting: Internal Medicine

## 2014-04-12 DIAGNOSIS — K219 Gastro-esophageal reflux disease without esophagitis: Secondary | ICD-10-CM

## 2014-04-12 MED ORDER — ESOMEPRAZOLE MAGNESIUM 40 MG PO PACK: 40.0000 mg | PACK | Freq: Every day | ORAL | Status: DC

## 2014-04-12 NOTE — Telephone Encounter (Signed)
rx sent to pharmacy. Message left at home

## 2014-04-29 NOTE — Telephone Encounter (Signed)
Talked with patient. He is feeling much better. If symptoms relapse will call for appointment otherwise will see him in 6 months.

## 2014-05-01 ENCOUNTER — Ambulatory Visit (INDEPENDENT_AMBULATORY_CARE_PROVIDER_SITE_OTHER): Payer: Medicare Other | Admitting: Internal Medicine

## 2014-05-01 ENCOUNTER — Encounter (INDEPENDENT_AMBULATORY_CARE_PROVIDER_SITE_OTHER): Payer: Self-pay | Admitting: Internal Medicine

## 2014-05-01 ENCOUNTER — Telehealth (INDEPENDENT_AMBULATORY_CARE_PROVIDER_SITE_OTHER): Payer: Self-pay | Admitting: *Deleted

## 2014-05-01 VITALS — BP 120/74 | HR 68 | Temp 97.8°F | Resp 18 | Ht 70.0 in | Wt 183.2 lb

## 2014-05-01 DIAGNOSIS — K219 Gastro-esophageal reflux disease without esophagitis: Secondary | ICD-10-CM | POA: Diagnosis not present

## 2014-05-01 DIAGNOSIS — R634 Abnormal weight loss: Secondary | ICD-10-CM | POA: Diagnosis not present

## 2014-05-01 DIAGNOSIS — K6389 Other specified diseases of intestine: Secondary | ICD-10-CM | POA: Diagnosis not present

## 2014-05-01 NOTE — Patient Instructions (Addendum)
Colonoscopy to be scheduled in 2019. Call office if diarrhea relapses or bloating becomes intractable.

## 2014-05-01 NOTE — Progress Notes (Signed)
Presenting complaint;  Follow-up for area of bloating and weight loss.  Database;  Patient is 69 year old Caucasian male was here for scheduled visit. He was initially seen in August 2015 for weight loss nausea and intractable flatulence and Timentin diarrhea. All in all he lost 20 pounds. Patient had been taking large doses of ibuprofen which she was advised to discontinue. He underwent EGD on 12/14/2013 revealing nonerosive enterogastritis. Due to the biopsy was negative for celiac disease. Gastric biopsy revealed chronic gastritis and H. pylori stains were negative. Patient was treated with 2 weeks of metronidazole with resolution of his nausea and anorexia bloating and diarrhea. He was last seen on 01/23/2014 and was feeling much better.  Subjective;  Patient states he's feeling well. He has gained 2 pounds since last visit. He saw Dr. Clovis Riley at [facility]. He underwent lab studies and chest and abdominopelvic CT and he remains in remission as for as melanoma is concerned. He denies diarrhea. He has 1-2 formed stools daily. He has intermittent mild bloating relieved with Pepto-Bismol. He has good appetite and no more nauseated. He does exercises for his lower back daily but he does not do any walking. Heartburns well controlled with PPI.   Current Medications: Outpatient Encounter Prescriptions as of 05/01/2014  Medication Sig  . bismuth subsalicylate (PEPTO BISMOL) 262 MG/15ML suspension Take 30 mLs by mouth every 6 (six) hours as needed.  . brimonidine-timolol (COMBIGAN) 0.2-0.5 % ophthalmic solution Place 1 drop into both eyes every 12 (twelve) hours.  Marland Kitchen esomeprazole (NEXIUM) 40 MG capsule Take 40 mg by mouth daily.   Marland Kitchen esomeprazole (NEXIUM) 40 MG packet Take 40 mg by mouth daily before breakfast.  . levothyroxine (SYNTHROID, LEVOTHROID) 125 MCG tablet Take 125 mcg by mouth daily before breakfast.  . saw palmetto 160 MG capsule Take 160 mg by mouth 2 (two) times daily.  . travoprost,  benzalkonium, (TRAVATAN) 0.004 % ophthalmic solution Place 1 drop into both eyes at bedtime.  . [DISCONTINUED] pantoprazole (PROTONIX) 40 MG tablet Take 1 tablet (40 mg total) by mouth daily. (Patient not taking: Reported on 05/01/2014)     Objective: Blood pressure 120/74, pulse 68, temperature 97.8 F (36.6 C), temperature source Oral, resp. rate 18, height 5\' 10"  (1.778 m), weight 183 lb 3.2 oz (83.099 kg). Patient is alert and in no acute distress. Conjunctiva is pink. Sclera is nonicteric Oropharyngeal mucosa is normal. No neck masses or thyromegaly noted. Cardiac exam with regular rhythm normal S1 and S2. No murmur or gallop noted. Lungs are clear to auscultation. Abdomen symmetrical. Soft and nontender without organomegaly or masses.  No LE edema or clubbing noted.  Labs/studies Results: Results from 03/09/2014 from  [facility] reviewed   H&H 13.6 and 39.4 Creatinine 0.79. Bilirubin 0.6, AP 75, AST 23, ALT 24, total protein 6.6 and albumin 3.7 Serum calcium 9.2. Chest and abdominopelvic CT report also reviewed. Report mentions coronary calcification.  Assessment:  #1. Small intestinal bacterial overgrowth. Diagnosis is not confirmed with hydrogen breath testing but he responded to two-week course of metronidazole. Duodenal biopsies were negative for celiac disease. If symptoms relapse would consider testing to confirm this diagnosis. #2. History of weight loss. Weight loss has reversed from low off 170 pounds and he has gained 2 pounds in the last 3 months. #3. GERD. Heartburn well controlled with therapy.    Plan:  Patient will call diarrhea relapses or if he develops intractable bloating. Regarding finding of coronary calcification on recent chest CT he will talk with Dr.  Hilma Favors whether or not he should undergo testing for cardiac disease. Office visit in 1 year. Next screening colonoscopy in 2019.

## 2014-05-01 NOTE — Telephone Encounter (Signed)
Jermie said the name of the nasal spray is Ipratropium Bromide Solution. He uses 2 sprays twice daily as needed.

## 2014-05-02 NOTE — Telephone Encounter (Signed)
This medication has been added to his medication list.

## 2014-05-02 NOTE — Telephone Encounter (Signed)
Patient was seen on 05/01/14. Per Dr. Laural Golden, f/u in one year. Patient has been put on the recall list.

## 2014-05-23 DIAGNOSIS — Z23 Encounter for immunization: Secondary | ICD-10-CM | POA: Diagnosis not present

## 2014-06-18 DIAGNOSIS — I10 Essential (primary) hypertension: Secondary | ICD-10-CM | POA: Diagnosis not present

## 2014-06-18 DIAGNOSIS — Z6826 Body mass index (BMI) 26.0-26.9, adult: Secondary | ICD-10-CM | POA: Diagnosis not present

## 2014-06-18 DIAGNOSIS — E890 Postprocedural hypothyroidism: Secondary | ICD-10-CM | POA: Diagnosis not present

## 2014-06-18 DIAGNOSIS — E781 Pure hyperglyceridemia: Secondary | ICD-10-CM | POA: Diagnosis not present

## 2014-06-18 DIAGNOSIS — H409 Unspecified glaucoma: Secondary | ICD-10-CM | POA: Diagnosis not present

## 2014-06-19 DIAGNOSIS — I10 Essential (primary) hypertension: Secondary | ICD-10-CM | POA: Diagnosis not present

## 2014-06-19 DIAGNOSIS — E781 Pure hyperglyceridemia: Secondary | ICD-10-CM | POA: Diagnosis not present

## 2014-06-19 DIAGNOSIS — E890 Postprocedural hypothyroidism: Secondary | ICD-10-CM | POA: Diagnosis not present

## 2014-06-19 DIAGNOSIS — H409 Unspecified glaucoma: Secondary | ICD-10-CM | POA: Diagnosis not present

## 2014-06-19 DIAGNOSIS — Z125 Encounter for screening for malignant neoplasm of prostate: Secondary | ICD-10-CM | POA: Diagnosis not present

## 2014-06-25 DIAGNOSIS — H4011X2 Primary open-angle glaucoma, moderate stage: Secondary | ICD-10-CM | POA: Diagnosis not present

## 2014-06-25 DIAGNOSIS — H4011X3 Primary open-angle glaucoma, severe stage: Secondary | ICD-10-CM | POA: Diagnosis not present

## 2014-08-07 ENCOUNTER — Ambulatory Visit (HOSPITAL_COMMUNITY)
Admission: RE | Admit: 2014-08-07 | Discharge: 2014-08-07 | Disposition: A | Discharge status: Home / Self Care | Payer: Medicare Other | Source: Ambulatory Visit | Attending: Orthopedic Surgery | Admitting: Orthopedic Surgery

## 2014-08-07 ENCOUNTER — Ambulatory Visit (INDEPENDENT_AMBULATORY_CARE_PROVIDER_SITE_OTHER): Payer: Medicare Other | Admitting: Orthopedic Surgery

## 2014-08-07 ENCOUNTER — Other Ambulatory Visit: Payer: Self-pay | Admitting: Orthopedic Surgery

## 2014-08-07 VITALS — BP 151/99 | Ht 70.0 in | Wt 182.2 lb

## 2014-08-07 DIAGNOSIS — M7062 Trochanteric bursitis, left hip: Secondary | ICD-10-CM

## 2014-08-07 DIAGNOSIS — M47816 Spondylosis without myelopathy or radiculopathy, lumbar region: Secondary | ICD-10-CM | POA: Diagnosis not present

## 2014-08-07 DIAGNOSIS — R1030 Lower abdominal pain, unspecified: Secondary | ICD-10-CM | POA: Insufficient documentation

## 2014-08-07 DIAGNOSIS — M5136 Other intervertebral disc degeneration, lumbar region: Secondary | ICD-10-CM | POA: Insufficient documentation

## 2014-08-07 DIAGNOSIS — M25552 Pain in left hip: Secondary | ICD-10-CM

## 2014-08-07 DIAGNOSIS — M1990 Unspecified osteoarthritis, unspecified site: Secondary | ICD-10-CM

## 2014-08-07 DIAGNOSIS — M1610 Unilateral primary osteoarthritis, unspecified hip: Secondary | ICD-10-CM

## 2014-08-07 DIAGNOSIS — M545 Low back pain: Secondary | ICD-10-CM | POA: Insufficient documentation

## 2014-08-07 NOTE — Patient Instructions (Signed)
Joint Injection  Care After  Refer to this sheet in the next few days. These instructions provide you with information on caring for yourself after you have had a joint injection. Your caregiver also may give you more specific instructions. Your treatment has been planned according to current medical practices, but problems sometimes occur. Call your caregiver if you have any problems or questions after your procedure.  After any type of joint injection, it is not uncommon to experience:  · Soreness, swelling, or bruising around the injection site.  · Mild numbness, tingling, or weakness around the injection site caused by the numbing medicine used before or with the injection.  It also is possible to experience the following effects associated with the specific agent after injection:  · Iodine-based contrast agents:  ¨ Allergic reaction (itching, hives, widespread redness, and swelling beyond the injection site).  · Corticosteroids (These effects are rare.):  ¨ Allergic reaction.  ¨ Increased blood sugar levels (If you have diabetes and you notice that your blood sugar levels have increased, notify your caregiver).  ¨ Increased blood pressure levels.  ¨ Mood swings.  · Hyaluronic acid in the use of viscosupplementation.  ¨ Temporary heat or redness.  ¨ Temporary rash and itching.  ¨ Increased fluid accumulation in the injected joint.  These effects all should resolve within a day after your procedure.   HOME CARE INSTRUCTIONS  · Limit yourself to light activity the day of your procedure. Avoid lifting heavy objects, bending, stooping, or twisting.  · Take prescription or over-the-counter pain medication as directed by your caregiver.  · You may apply ice to your injection site to reduce pain and swelling the day of your procedure. Ice may be applied 03-04 times:  ¨ Put ice in a plastic bag.  ¨ Place a towel between your skin and the bag.  ¨ Leave the ice on for no longer than 15-20 minutes each time.  SEEK  IMMEDIATE MEDICAL CARE IF:   · Pain and swelling get worse rather than better or extend beyond the injection site.  · Numbness does not go away.  · Blood or fluid continues to leak from the injection site.  · You have chest pain.  · You have swelling of your face or tongue.  · You have trouble breathing or you become dizzy.  · You develop a fever, chills, or severe tenderness at the injection site that last longer than 1 day.  MAKE SURE YOU:  · Understand these instructions.  · Watch your condition.  · Get help right away if you are not doing well or if you get worse.  Document Released: 11/27/2010 Document Revised: 06/08/2011 Document Reviewed: 11/27/2010  ExitCare® Patient Information ©2015 [employer]. This information is not intended to replace advice given to you by your health care provider. Make sure you discuss any questions you have with your health care provider.

## 2014-08-07 NOTE — Progress Notes (Signed)
Patient ID: Darryl Phillips, male   DOB: [birth year], 70 y.o.   MRN: [id number] Patient ID: Darryl Phillips, male   DOB: May 23, 1945, 69 y.o.   MRN: [id number]  Chief Complaint  Patient presents with  . Hip Pain    left hip pain x 4 months, no known injury     Darryl Phillips is a 69 y.o. male.   HPI 69 year old male pain and weakness in his left hip and groin area for 4 months. He has a history of degenerative disc disease and was sent for physical therapy but couldn't do his abduction exercises for the left hip. Pain is in the groin and radiates out towards the iliac crest. Pain is 8 out of 10 worse with activity. He also notes he can't roll over onto his left side at night it wakes him up Review of Systems Recent weight loss fatigue had melanoma treatment successful in follow-up; Sinus problems reported on review of systems glaucoma, excess night urination, diarrhea, seasonal allergy, back pain.  Past Medical History  Diagnosis Date  . Thyroid disease   . Glaucoma   . Shingles   . Cancer     Melanoma back 2009    Past Surgical History  Procedure Laterality Date  . Melonoma      x 4 sugeries  . Cholecystectomy      Dr. Arnoldo Morale  . Esophagogastroduodenoscopy N/A 12/14/2013    Procedure: ESOPHAGOGASTRODUODENOSCOPY (EGD);  Surgeon: Rogene Houston, MD;  [facility];  Service: Endoscopy;  Laterality: N/A;  300    No family history on file.  Social History History  Substance Use Topics  . Smoking status: Former Smoker    Quit date: 01/23/1969  . Smokeless tobacco: Never Used  . Alcohol Use: No    Allergies  Allergen Reactions  . Azithromycin Nausea And Vomiting  . Tetracyclines & Related     Current Outpatient Prescriptions  Medication Sig Dispense Refill  . bismuth subsalicylate (PEPTO BISMOL) 262 MG/15ML suspension Take 30 mLs by mouth every 6 (six) hours as needed.    . brimonidine-timolol (COMBIGAN) 0.2-0.5 % ophthalmic solution Place 1 drop into both eyes  every 12 (twelve) hours.    Marland Kitchen esomeprazole (NEXIUM) 40 MG capsule Take 40 mg by mouth daily.     Marland Kitchen esomeprazole (NEXIUM) 40 MG packet Take 40 mg by mouth daily before breakfast. 30 each 12  . IPRATROPIUM BROMIDE NA Place into the nose. 2 sprays each nostril twice daily, as needed.    Marland Kitchen levothyroxine (SYNTHROID, LEVOTHROID) 125 MCG tablet Take 125 mcg by mouth daily before breakfast.    . Probiotic Product (PROBIOTIC DAILY PO) Take by mouth.    . saw palmetto 160 MG capsule Take 160 mg by mouth 2 (two) times daily.    . travoprost, benzalkonium, (TRAVATAN) 0.004 % ophthalmic solution Place 1 drop into both eyes at bedtime.     No current facility-administered medications for this visit.       Physical Exam Blood pressure 151/99, height 5\' 10"  (1.778 m), weight 182 lb 3.2 oz (82.645 kg). Physical Exam The patient is well developed well nourished and well groomed. Orientation to person place and time is normal  Mood is pleasant. 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

## 2014-09-05 DIAGNOSIS — H1032 Unspecified acute conjunctivitis, left eye: Secondary | ICD-10-CM | POA: Diagnosis not present

## 2014-09-11 DIAGNOSIS — L570 Actinic keratosis: Secondary | ICD-10-CM | POA: Diagnosis not present

## 2014-09-11 DIAGNOSIS — Z85828 Personal history of other malignant neoplasm of skin: Secondary | ICD-10-CM | POA: Diagnosis not present

## 2014-09-17 DIAGNOSIS — K219 Gastro-esophageal reflux disease without esophagitis: Secondary | ICD-10-CM | POA: Diagnosis not present

## 2014-09-17 DIAGNOSIS — E063 Autoimmune thyroiditis: Secondary | ICD-10-CM | POA: Diagnosis not present

## 2014-09-17 DIAGNOSIS — E663 Overweight: Secondary | ICD-10-CM | POA: Diagnosis not present

## 2014-09-17 DIAGNOSIS — M1991 Primary osteoarthritis, unspecified site: Secondary | ICD-10-CM | POA: Diagnosis not present

## 2014-09-17 DIAGNOSIS — J0100 Acute maxillary sinusitis, unspecified: Secondary | ICD-10-CM | POA: Diagnosis not present

## 2014-09-17 DIAGNOSIS — I10 Essential (primary) hypertension: Secondary | ICD-10-CM | POA: Diagnosis not present

## 2014-09-17 DIAGNOSIS — Z6826 Body mass index (BMI) 26.0-26.9, adult: Secondary | ICD-10-CM | POA: Diagnosis not present

## 2014-09-17 DIAGNOSIS — J209 Acute bronchitis, unspecified: Secondary | ICD-10-CM | POA: Diagnosis not present

## 2014-12-05 DIAGNOSIS — L570 Actinic keratosis: Secondary | ICD-10-CM | POA: Diagnosis not present

## 2014-12-05 DIAGNOSIS — L810 Postinflammatory hyperpigmentation: Secondary | ICD-10-CM | POA: Diagnosis not present

## 2014-12-25 DIAGNOSIS — H4011X1 Primary open-angle glaucoma, mild stage: Secondary | ICD-10-CM | POA: Diagnosis not present

## 2014-12-25 DIAGNOSIS — H4011X2 Primary open-angle glaucoma, moderate stage: Secondary | ICD-10-CM | POA: Diagnosis not present

## 2015-01-08 DIAGNOSIS — H401122 Primary open-angle glaucoma, left eye, moderate stage: Secondary | ICD-10-CM | POA: Diagnosis not present

## 2015-01-22 DIAGNOSIS — Z23 Encounter for immunization: Secondary | ICD-10-CM | POA: Diagnosis not present

## 2015-01-31 DIAGNOSIS — Z6826 Body mass index (BMI) 26.0-26.9, adult: Secondary | ICD-10-CM | POA: Diagnosis not present

## 2015-01-31 DIAGNOSIS — E039 Hypothyroidism, unspecified: Secondary | ICD-10-CM | POA: Diagnosis not present

## 2015-01-31 DIAGNOSIS — M5136 Other intervertebral disc degeneration, lumbar region: Secondary | ICD-10-CM | POA: Diagnosis not present

## 2015-01-31 DIAGNOSIS — M545 Low back pain: Secondary | ICD-10-CM | POA: Diagnosis not present

## 2015-01-31 DIAGNOSIS — Z1389 Encounter for screening for other disorder: Secondary | ICD-10-CM | POA: Diagnosis not present

## 2015-02-06 ENCOUNTER — Encounter (INDEPENDENT_AMBULATORY_CARE_PROVIDER_SITE_OTHER): Payer: Self-pay | Admitting: *Deleted

## 2015-03-13 DIAGNOSIS — Z85820 Personal history of malignant melanoma of skin: Secondary | ICD-10-CM | POA: Diagnosis not present

## 2015-03-13 DIAGNOSIS — C434 Malignant melanoma of scalp and neck: Secondary | ICD-10-CM | POA: Diagnosis not present

## 2015-03-13 DIAGNOSIS — C439 Malignant melanoma of skin, unspecified: Secondary | ICD-10-CM | POA: Diagnosis not present

## 2015-03-13 DIAGNOSIS — R042 Hemoptysis: Secondary | ICD-10-CM | POA: Diagnosis not present

## 2015-03-20 DIAGNOSIS — Z23 Encounter for immunization: Secondary | ICD-10-CM | POA: Diagnosis not present

## 2015-04-09 DIAGNOSIS — L309 Dermatitis, unspecified: Secondary | ICD-10-CM | POA: Diagnosis not present

## 2015-04-09 DIAGNOSIS — Z6826 Body mass index (BMI) 26.0-26.9, adult: Secondary | ICD-10-CM | POA: Diagnosis not present

## 2015-04-09 DIAGNOSIS — J3089 Other allergic rhinitis: Secondary | ICD-10-CM | POA: Diagnosis not present

## 2015-05-04 ENCOUNTER — Other Ambulatory Visit (INDEPENDENT_AMBULATORY_CARE_PROVIDER_SITE_OTHER): Payer: Self-pay | Admitting: Internal Medicine

## 2015-05-16 ENCOUNTER — Ambulatory Visit (INDEPENDENT_AMBULATORY_CARE_PROVIDER_SITE_OTHER): Payer: Medicare Other | Admitting: Internal Medicine

## 2015-05-16 DIAGNOSIS — E663 Overweight: Secondary | ICD-10-CM | POA: Diagnosis not present

## 2015-05-16 DIAGNOSIS — Z6826 Body mass index (BMI) 26.0-26.9, adult: Secondary | ICD-10-CM | POA: Diagnosis not present

## 2015-05-16 DIAGNOSIS — B354 Tinea corporis: Secondary | ICD-10-CM | POA: Diagnosis not present

## 2015-05-16 DIAGNOSIS — Z1389 Encounter for screening for other disorder: Secondary | ICD-10-CM | POA: Diagnosis not present

## 2015-05-21 DIAGNOSIS — L570 Actinic keratosis: Secondary | ICD-10-CM | POA: Diagnosis not present

## 2015-05-21 DIAGNOSIS — Z85828 Personal history of other malignant neoplasm of skin: Secondary | ICD-10-CM | POA: Diagnosis not present

## 2015-05-22 ENCOUNTER — Encounter (INDEPENDENT_AMBULATORY_CARE_PROVIDER_SITE_OTHER): Payer: Self-pay | Admitting: Internal Medicine

## 2015-05-22 ENCOUNTER — Ambulatory Visit (INDEPENDENT_AMBULATORY_CARE_PROVIDER_SITE_OTHER): Payer: Medicare Other | Admitting: Internal Medicine

## 2015-05-22 VITALS — BP 150/70 | HR 72 | Temp 98.1°F | Ht 70.0 in | Wt 182.3 lb

## 2015-05-22 DIAGNOSIS — K6389 Other specified diseases of intestine: Secondary | ICD-10-CM | POA: Diagnosis not present

## 2015-05-22 DIAGNOSIS — K219 Gastro-esophageal reflux disease without esophagitis: Secondary | ICD-10-CM | POA: Diagnosis not present

## 2015-05-22 NOTE — Patient Instructions (Addendum)
Continue the Nexium. OV in 1 year. Samples of Dexilant x 6 boxes given to patient.

## 2015-05-22 NOTE — Progress Notes (Signed)
Subjective:    Patient ID: Darryl Phillips, male    DOB: [birth year], 70 y.o.   MRN: [id number]  HPIHere today for f/u. He was last seen by Dr. Laural Golden in February of 2016. He followed up in December at [facility] for his melanoma with Dr. Lennie Odor. He underwent a CT which revealed:   CT OF THE CHEST, ABDOMEN AND PELVIS WITH INTRAVENOUS CONTRAST, Mar 13, 2015  CONCLUSIONS: Personally reviewed the CT scan and to my eye; 1. No evidence of recurrent or metastatic disease within the chest, abdomen, or pelvis. 2. Resolution of right lower lobe atelectasis. 3. Otherwise stable ancillary findings.  He is doing well. He has maintained his weight.  For the past month he has had diarrhea. He describes the diarrhea  as loose but not watery. He has increased fiber in his diet. He eats cereal in the morning. There has been no nausea.  He also points to his throat ? Indigestion.  He is watching what he eats. Stays away from spicy foods and red meats. Maintained on Nexium.  He takes a Probiotic daily. He avoid OJ. Hes avoid soft drinks.  He denies any melena or BRRB.      12/14/2013: EGD  Indications: Patient is 70 year old Caucasian male with history of malignant melanoma in remission who presents with 4-6 week history of nausea bloating and burping and excessive flatus as well as diarrhea and 20 pound weight loss. She is on ibuprofen. There is no history of recent antibiotic use or travel abroad.  Impression: Nonerosive antral gastritis otherwise normal EGD. Biopsies taken from antral and post bulbar duodenal mucosa.  Gastric biopsy shows moderate gastritis. H. pylori stains negative. Patient feels much better while on metronidazole. He has another 9 days of therapy left.     Review of Systems Past Medical History  Diagnosis Date  . Thyroid disease   . Glaucoma   . Shingles   . Cancer [facility])     Melanoma back 2009    Past Surgical History  Procedure Laterality Date  . Melonoma      x 4  sugeries  . Cholecystectomy      Dr. Arnoldo Morale  . Esophagogastroduodenoscopy N/A 12/14/2013    Procedure: ESOPHAGOGASTRODUODENOSCOPY (EGD);  Surgeon: Rogene Houston, MD;  Location: [facility];  Service: Endoscopy;  Laterality: N/A;  300    Allergies  Allergen Reactions  . Azithromycin Nausea And Vomiting  . Tetracyclines & Related     Current Outpatient Prescriptions on File Prior to Visit  Medication Sig Dispense Refill  . bismuth subsalicylate (PEPTO BISMOL) 262 MG/15ML suspension Take 30 mLs by mouth every 6 (six) hours as needed.    Marland Kitchen esomeprazole (NEXIUM) 40 MG packet Take 40 mg by mouth daily before breakfast. 30 each 12  . levothyroxine (SYNTHROID, LEVOTHROID) 125 MCG tablet Take 125 mcg by mouth daily before breakfast.    . Probiotic Product (PROBIOTIC DAILY PO) Take by mouth.    . saw palmetto 160 MG capsule Take 160 mg by mouth 2 (two) times daily.    . travoprost, benzalkonium, (TRAVATAN) 0.004 % ophthalmic solution Place 1 drop into both eyes at bedtime.    Marland Kitchen NEXIUM 40 MG capsule take 1 capsule by mouth every morning BEFORE BREAKFAST (Patient not taking: Reported on 05/22/2015) 30 capsule 6   No current facility-administered medications on file prior to visit.        Objective:   Physical ExamBlood pressure 150/70, pulse 72, temperature 98.1 F (36.7  C), height 5\' 10"  (1.778 m), weight 182 lb 4.8 oz (82.691 kg). Alert and oriented. Skin warm and dry. Oral mucosa is moist.   . Sclera anicteric, conjunctivae is pink. Thyroid not enlarged. No cervical lymphadenopathy. Lungs clear. Heart regular rate and rhythm.  Abdomen is soft. Bowel sounds are positive. No hepatomegaly. No abdominal masses felt. No tenderness.  No edema to lower extremities.          Assessment & Plan:     Small intestinal bacterial overgrowth. Diagnosis is not confirmed with hydrogen breath testing but he responded to two-week course of metronidazole. Duodenal biopsies were negative for celiac  disease. I He is doing well. He denies any bloating.   Marland Kitchen History of weight loss. He has maintained his weight. Appetite has remain good.   . GERD. Heartburn well controlled with therapy. Am going to give him 6 samples of Dexilant to try though I feel his insurance will not pay for this.

## 2015-07-09 DIAGNOSIS — H401111 Primary open-angle glaucoma, right eye, mild stage: Secondary | ICD-10-CM | POA: Diagnosis not present

## 2015-07-09 DIAGNOSIS — H401122 Primary open-angle glaucoma, left eye, moderate stage: Secondary | ICD-10-CM | POA: Diagnosis not present

## 2015-08-22 ENCOUNTER — Encounter (INDEPENDENT_AMBULATORY_CARE_PROVIDER_SITE_OTHER): Payer: Self-pay | Admitting: Internal Medicine

## 2015-08-22 ENCOUNTER — Ambulatory Visit (INDEPENDENT_AMBULATORY_CARE_PROVIDER_SITE_OTHER): Payer: Medicare Other | Admitting: Internal Medicine

## 2015-08-22 VITALS — BP 144/75 | HR 72 | Temp 98.0°F | Ht 70.0 in | Wt 180.1 lb

## 2015-08-22 DIAGNOSIS — R197 Diarrhea, unspecified: Secondary | ICD-10-CM | POA: Diagnosis not present

## 2015-08-22 DIAGNOSIS — Z1211 Encounter for screening for malignant neoplasm of colon: Secondary | ICD-10-CM | POA: Diagnosis not present

## 2015-08-22 MED ORDER — METRONIDAZOLE 250 MG PO TABS: 250.0000 mg | ORAL_TABLET | Freq: Three times a day (TID) | ORAL | Status: DC

## 2015-08-22 NOTE — Progress Notes (Signed)
Subjective:    Patient ID: Darryl Phillips, male    DOB: Dec 19, 1945, 70 y.o.   MRN: [id number]  HPIPresents today with c/o diarrhea or loose stools. Stools are not watery. Usually has one stool a day.  He has been taking 1/2 of an Imodium what keeps it in check for a couple of days. Takes a Probiotic daily as well as fiber.  Symptoms for a couple of weeks. He has lost about 2 pounds. There has been no fever. He has a lot of flatus. When he bloats he feels bad and no energy,. He has responded to Flagyl in the past for suspected bacterial overgrowth.  Patient also request to be screening for Hepatitis C.    No recent antibiotics. No abdominal pain.  Appetite is good.  No sodas or carbonated water. He eats all the fiber he can eat.  Drinks almond milk with his cereal and a banana a day.  No ice cream.          12/14/2013: EGD  Indications: Patient is 70 year old Caucasian male with history of malignant melanoma in remission who presents with 4-6 week history of nausea bloating and burping and excessive flatus as well as diarrhea and 20 pound weight loss. She is on ibuprofen. There is no history of recent antibiotic use or travel abroad.  Impression: Nonerosive antral gastritis otherwise normal EGD. Biopsies taken from antral and post bulbar duodenal mucosa.  Gastric biopsy shows moderate gastritis. H. pylori stains negative. Patient feels much better while on metronidazole. He has another 9 days of therapy left.  Review of Systems Past Medical History  Diagnosis Date  . Thyroid disease   . Glaucoma   . Shingles   . Cancer [facility])     Melanoma back 2009    Past Surgical History  Procedure Laterality Date  . Melonoma      x 4 sugeries  . Cholecystectomy      Dr. Arnoldo Morale  . Esophagogastroduodenoscopy N/A 12/14/2013    Procedure: ESOPHAGOGASTRODUODENOSCOPY (EGD);  Surgeon: Rogene Houston, MD;  Location[facility];  Service: Endoscopy;  Laterality: N/A;  300     Allergies  Allergen Reactions  . Azithromycin Nausea And Vomiting  . Tetracyclines & Related     Current Outpatient Prescriptions on File Prior to Visit  Medication Sig Dispense Refill  . bismuth subsalicylate (PEPTO BISMOL) 262 MG/15ML suspension Take 30 mLs by mouth every 6 (six) hours as needed.    Marland Kitchen esomeprazole (NEXIUM) 40 MG packet Take 40 mg by mouth daily before breakfast. 30 each 12  . levothyroxine (SYNTHROID, LEVOTHROID) 125 MCG tablet Take 125 mcg by mouth daily before breakfast.    . NEXIUM 40 MG capsule take 1 capsule by mouth every morning BEFORE BREAKFAST 30 capsule 6  . Probiotic Product (PROBIOTIC DAILY PO) Take by mouth.    . saw palmetto 160 MG capsule Take 160 mg by mouth 2 (two) times daily.    . timolol (BETIMOL) 0.5 % ophthalmic solution 1 drop 2 (two) times daily.    . travoprost, benzalkonium, (TRAVATAN) 0.004 % ophthalmic solution Place 1 drop into both eyes at bedtime.     No current facility-administered medications on file prior to visit.        Objective:   Physical Exam Blood pressure 144/75, pulse 72, temperature 98 F (36.7 C), height 5\' 10"  (1.778 m), weight 180 lb 1.6 oz (81.693 kg). Alert and oriented. Skin warm and dry. Oral mucosa is moist.   .  Sclera anicteric, conjunctivae is pink. Thyroid not enlarged. No cervical lymphadenopathy. Lungs clear. Heart regular rate and rhythm.  Abdomen is soft. Bowel sounds are positive. No hepatomegaly. No abdominal masses felt. No tenderness.  No edema to lower extremities.         Assessment & Plan:     Small intestinal bacterial overgrowthdiarrhea. Am going to start him on Flagyl 250mg  TID x 10 days. Samples of IBGARD given to patient.  Duodenal biopsies were negative for celiac disease.  Samples of IBGARD given to patient.  He will call with a PR report.    #3. GERD. Heartburn well controlled with therapy.

## 2015-08-22 NOTE — Patient Instructions (Addendum)
Flagyl 250mg  tid x 10 days.  Hep C antibody.

## 2015-08-23 LAB — HEPATITIS C ANTIBODY: HCV Ab: NEGATIVE

## 2015-09-11 DIAGNOSIS — Z08 Encounter for follow-up examination after completed treatment for malignant neoplasm: Secondary | ICD-10-CM | POA: Diagnosis not present

## 2015-09-11 DIAGNOSIS — Z85820 Personal history of malignant melanoma of skin: Secondary | ICD-10-CM | POA: Diagnosis not present

## 2015-09-11 DIAGNOSIS — K5790 Diverticulosis of intestine, part unspecified, without perforation or abscess without bleeding: Secondary | ICD-10-CM | POA: Diagnosis not present

## 2015-09-11 DIAGNOSIS — C434 Malignant melanoma of scalp and neck: Secondary | ICD-10-CM | POA: Diagnosis not present

## 2015-09-11 DIAGNOSIS — Z9889 Other specified postprocedural states: Secondary | ICD-10-CM | POA: Diagnosis not present

## 2015-11-25 DIAGNOSIS — L570 Actinic keratosis: Secondary | ICD-10-CM | POA: Diagnosis not present

## 2015-12-10 DIAGNOSIS — Z23 Encounter for immunization: Secondary | ICD-10-CM | POA: Diagnosis not present

## 2015-12-10 DIAGNOSIS — M1991 Primary osteoarthritis, unspecified site: Secondary | ICD-10-CM | POA: Diagnosis not present

## 2015-12-10 DIAGNOSIS — M5136 Other intervertebral disc degeneration, lumbar region: Secondary | ICD-10-CM | POA: Diagnosis not present

## 2015-12-10 DIAGNOSIS — Z125 Encounter for screening for malignant neoplasm of prostate: Secondary | ICD-10-CM | POA: Diagnosis not present

## 2015-12-10 DIAGNOSIS — Z1389 Encounter for screening for other disorder: Secondary | ICD-10-CM | POA: Diagnosis not present

## 2015-12-10 DIAGNOSIS — E039 Hypothyroidism, unspecified: Secondary | ICD-10-CM | POA: Diagnosis not present

## 2015-12-10 DIAGNOSIS — Z6826 Body mass index (BMI) 26.0-26.9, adult: Secondary | ICD-10-CM | POA: Diagnosis not present

## 2015-12-13 ENCOUNTER — Other Ambulatory Visit (INDEPENDENT_AMBULATORY_CARE_PROVIDER_SITE_OTHER): Payer: Self-pay | Admitting: Internal Medicine

## 2016-01-08 DIAGNOSIS — H401111 Primary open-angle glaucoma, right eye, mild stage: Secondary | ICD-10-CM | POA: Diagnosis not present

## 2016-01-08 DIAGNOSIS — H401122 Primary open-angle glaucoma, left eye, moderate stage: Secondary | ICD-10-CM | POA: Diagnosis not present

## 2016-01-29 DIAGNOSIS — H2513 Age-related nuclear cataract, bilateral: Secondary | ICD-10-CM | POA: Diagnosis not present

## 2016-02-03 DIAGNOSIS — E890 Postprocedural hypothyroidism: Secondary | ICD-10-CM | POA: Diagnosis not present

## 2016-02-03 DIAGNOSIS — I10 Essential (primary) hypertension: Secondary | ICD-10-CM | POA: Diagnosis not present

## 2016-02-03 DIAGNOSIS — H2513 Age-related nuclear cataract, bilateral: Secondary | ICD-10-CM | POA: Diagnosis not present

## 2016-02-03 DIAGNOSIS — H401111 Primary open-angle glaucoma, right eye, mild stage: Secondary | ICD-10-CM | POA: Diagnosis not present

## 2016-02-03 DIAGNOSIS — Z0000 Encounter for general adult medical examination without abnormal findings: Secondary | ICD-10-CM | POA: Diagnosis not present

## 2016-02-03 DIAGNOSIS — H401122 Primary open-angle glaucoma, left eye, moderate stage: Secondary | ICD-10-CM | POA: Diagnosis not present

## 2016-02-03 DIAGNOSIS — Z6825 Body mass index (BMI) 25.0-25.9, adult: Secondary | ICD-10-CM | POA: Diagnosis not present

## 2016-02-03 DIAGNOSIS — E663 Overweight: Secondary | ICD-10-CM | POA: Diagnosis not present

## 2016-02-03 DIAGNOSIS — Z85820 Personal history of malignant melanoma of skin: Secondary | ICD-10-CM | POA: Diagnosis not present

## 2016-02-13 DIAGNOSIS — H401122 Primary open-angle glaucoma, left eye, moderate stage: Secondary | ICD-10-CM | POA: Diagnosis not present

## 2016-02-13 DIAGNOSIS — H2512 Age-related nuclear cataract, left eye: Secondary | ICD-10-CM | POA: Diagnosis not present

## 2016-02-24 ENCOUNTER — Encounter (INDEPENDENT_AMBULATORY_CARE_PROVIDER_SITE_OTHER): Payer: Self-pay | Admitting: Internal Medicine

## 2016-02-24 ENCOUNTER — Ambulatory Visit (INDEPENDENT_AMBULATORY_CARE_PROVIDER_SITE_OTHER): Payer: Medicare Other | Admitting: Internal Medicine

## 2016-02-24 ENCOUNTER — Encounter (INDEPENDENT_AMBULATORY_CARE_PROVIDER_SITE_OTHER): Payer: Self-pay

## 2016-02-24 VITALS — BP 144/70 | HR 64 | Temp 98.1°F | Ht 70.0 in | Wt 182.3 lb

## 2016-02-24 DIAGNOSIS — R197 Diarrhea, unspecified: Secondary | ICD-10-CM

## 2016-02-24 NOTE — Patient Instructions (Signed)
OV in 1 year.  

## 2016-02-24 NOTE — Progress Notes (Signed)
   Subjective:    Patient ID: Darryl Phillips, male    DOB: [birth year], 70 y.o.   MRN: [id number]  HPI Here today for f/u. Last seen in May with c/o diarrhea or loose stools. Stools were not watery. Having one stool a day. Rx given for Flagyl for possible bacterial overgrowth.  Last weight 180.  He is doing good for the most part. He is averaging diarrhea about 3 times a weeks. There is no abdominal pain.  Stools are sometimes watery. His appetite is good. No weight loss. Symptoms for years. 12/14/2013: EGD  Indications: Patient is 70 year old Caucasian male with history of malignant melanoma in remission who presents with 4-6 week history of nausea bloating and burping and excessive flatus as well as diarrhea and 20 pound weight loss. She is on ibuprofen. There is no history of recent antibiotic use or travel abroad.  Impression: Nonerosive antral gastritis otherwise normal EGD. Biopsies taken from antral and post bulbar duodenal mucosa.  Gastric biopsy shows moderate gastritis. H. pylori stains negative. Patient feels much better while on metronidazole. He has another 9 days of therapy left. Review of Systems     Objective:   Physical Exam Blood pressure (!) 144/70, pulse 64, temperature 98.1 F (36.7 C), height 5\' 10"  (1.778 m), weight 182 lb 4.8 oz (82.7 kg).  Alert and oriented. Skin warm and dry. Oral mucosa is moist.   . Sclera anicteric, conjunctivae is pink. Thyroid not enlarged. No cervical lymphadenopathy. Lungs clear. Heart regular rate and rhythm.  Abdomen is soft. Bowel sounds are positive. No hepatomegaly. No abdominal masses felt. No tenderness.  No edema to lower extremities.        Assessment & Plan:  Diarrhea. He is doing good.   OV in 6 months.

## 2016-03-24 DIAGNOSIS — Z6826 Body mass index (BMI) 26.0-26.9, adult: Secondary | ICD-10-CM | POA: Diagnosis not present

## 2016-03-24 DIAGNOSIS — H8303 Labyrinthitis, bilateral: Secondary | ICD-10-CM | POA: Diagnosis not present

## 2016-03-24 DIAGNOSIS — E663 Overweight: Secondary | ICD-10-CM | POA: Diagnosis not present

## 2016-03-24 DIAGNOSIS — R42 Dizziness and giddiness: Secondary | ICD-10-CM | POA: Diagnosis not present

## 2016-03-24 DIAGNOSIS — Z1389 Encounter for screening for other disorder: Secondary | ICD-10-CM | POA: Diagnosis not present

## 2016-05-25 DIAGNOSIS — L570 Actinic keratosis: Secondary | ICD-10-CM | POA: Diagnosis not present

## 2016-06-05 ENCOUNTER — Other Ambulatory Visit (INDEPENDENT_AMBULATORY_CARE_PROVIDER_SITE_OTHER): Payer: Self-pay | Admitting: Internal Medicine

## 2016-06-29 DIAGNOSIS — H401122 Primary open-angle glaucoma, left eye, moderate stage: Secondary | ICD-10-CM | POA: Diagnosis not present

## 2016-06-29 DIAGNOSIS — H2511 Age-related nuclear cataract, right eye: Secondary | ICD-10-CM | POA: Diagnosis not present

## 2016-08-26 ENCOUNTER — Ambulatory Visit (INDEPENDENT_AMBULATORY_CARE_PROVIDER_SITE_OTHER): Payer: Medicare Other | Admitting: Internal Medicine

## 2016-08-26 ENCOUNTER — Encounter (INDEPENDENT_AMBULATORY_CARE_PROVIDER_SITE_OTHER): Payer: Self-pay | Admitting: Internal Medicine

## 2016-08-26 ENCOUNTER — Encounter (INDEPENDENT_AMBULATORY_CARE_PROVIDER_SITE_OTHER): Payer: Self-pay

## 2016-08-26 VITALS — BP 160/70 | HR 72 | Temp 98.0°F | Ht 70.0 in | Wt 183.0 lb

## 2016-08-26 DIAGNOSIS — R197 Diarrhea, unspecified: Secondary | ICD-10-CM

## 2016-08-26 NOTE — Patient Instructions (Addendum)
OV in 6 months. 

## 2016-08-26 NOTE — Progress Notes (Signed)
Subjective:    Patient ID: Darryl Phillips, male    DOB: Jan 28, 1946, 71 y.o.   MRN: [id number]  HPI Here today for f/u. Last seen one year ago. He plays once a week at [facility]. He has maintained his weight. His appetite is good. He has a loose every morning. He has one stool a day.  No abdominal pain. He does have a lot of gas.  No melnea or BRRB.  Next colonoscopy will be in September of 2019  Hepatitis C negative.  Hx of melanoma and is followed by Dr. Lennie Odor at [facility]. Has f/u appt 09/09/2016. 12/14/2013: EGD  Indications: Patient is 71 year old Caucasian male with history of malignant melanoma in remission who presents with 4-6 week history of nausea bloating and burping and excessive flatus as well as diarrhea and 20 pound weight loss. She is on ibuprofen. There is no history of recent antibiotic use or travel abroad.  Impression: Nonerosive antral gastritis otherwise normal EGD. Biopsies taken from antral and post bulbar duodenal mucosa.  Gastric biopsy shows moderate gastritis. H. pylori stains negative. Patient feels much better while on metronidazole. He has another 9 days of therapy left.   Review of Systems Past Medical History:  Diagnosis Date  . Cancer ([place])    Melanoma back 2009  . Glaucoma   . Shingles   . Thyroid disease     Past Surgical History:  Procedure Laterality Date  . cataract surgery     [facility] 2018. Also has a stent in his eye during surgery  . CHOLECYSTECTOMY     Dr. Arnoldo Morale  . ESOPHAGOGASTRODUODENOSCOPY N/A 12/14/2013   Procedure: ESOPHAGOGASTRODUODENOSCOPY (EGD);  Surgeon: Rogene Houston, MD;  [facility];  Service: Endoscopy;  Laterality: N/A;  300  . melonoma     x 4 sugeries    Allergies  Allergen Reactions  . Azithromycin Nausea And Vomiting  . Tetracyclines & Related     Current Outpatient Prescriptions on File Prior to Visit  Medication Sig Dispense Refill  . bismuth subsalicylate (PEPTO BISMOL) 262  MG/15ML suspension Take 30 mLs by mouth every 6 (six) hours as needed.    Marland Kitchen esomeprazole (NEXIUM) 40 MG capsule TAKE ONE CAPSULE BY MOUTH ONCE DAILY IN THE MORNING BEFORE BREAKFAST 30 capsule 5  . levothyroxine (SYNTHROID, LEVOTHROID) 125 MCG tablet Take 125 mcg by mouth daily before breakfast.    . Probiotic Product (PROBIOTIC DAILY PO) Take by mouth.    . saw palmetto 160 MG capsule Take 160 mg by mouth 2 (two) times daily.    . timolol (BETIMOL) 0.5 % ophthalmic solution 1 drop 2 (two) times daily.    . travoprost, benzalkonium, (TRAVATAN) 0.004 % ophthalmic solution Place 1 drop into both eyes at bedtime.    Marland Kitchen esomeprazole (NEXIUM) 40 MG packet Take 40 mg by mouth daily before breakfast. (Patient not taking: Reported on 08/26/2016) 30 each 12   No current facility-administered medications on file prior to visit.         Objective:   Physical Exam.Blood pressure (!) 160/70, pulse 72, temperature 98 F (36.7 C), height 5\' 10"  (1.778 m), weight 183 lb (83 kg).  Alert and oriented. Skin warm and dry. Oral mucosa is moist.   . Sclera anicteric, conjunctivae is pink. Thyroid not enlarged. No cervical lymphadenopathy. Lungs clear. Heart regular rate and rhythm.  Abdomen is soft. Bowel sounds are positive. No hepatomegaly. No abdominal masses felt. No tenderness.  No edema to lower extremities.  Assessment & Plan:  Diarrhea. Chronic. Samples if IBGARD given to patient. OV in 6 months.

## 2016-08-27 ENCOUNTER — Encounter (INDEPENDENT_AMBULATORY_CARE_PROVIDER_SITE_OTHER): Payer: Self-pay | Admitting: Internal Medicine

## 2016-09-09 DIAGNOSIS — C434 Malignant melanoma of scalp and neck: Secondary | ICD-10-CM | POA: Diagnosis not present

## 2016-09-09 DIAGNOSIS — K7689 Other specified diseases of liver: Secondary | ICD-10-CM | POA: Diagnosis not present

## 2016-09-09 DIAGNOSIS — Z08 Encounter for follow-up examination after completed treatment for malignant neoplasm: Secondary | ICD-10-CM | POA: Diagnosis not present

## 2016-09-09 DIAGNOSIS — Z85820 Personal history of malignant melanoma of skin: Secondary | ICD-10-CM | POA: Diagnosis not present

## 2016-09-09 DIAGNOSIS — K5790 Diverticulosis of intestine, part unspecified, without perforation or abscess without bleeding: Secondary | ICD-10-CM | POA: Diagnosis not present

## 2016-09-09 DIAGNOSIS — C439 Malignant melanoma of skin, unspecified: Secondary | ICD-10-CM | POA: Diagnosis not present

## 2016-10-05 DIAGNOSIS — H401122 Primary open-angle glaucoma, left eye, moderate stage: Secondary | ICD-10-CM | POA: Diagnosis not present

## 2016-11-04 DIAGNOSIS — I10 Essential (primary) hypertension: Secondary | ICD-10-CM | POA: Diagnosis not present

## 2016-11-04 DIAGNOSIS — Z6826 Body mass index (BMI) 26.0-26.9, adult: Secondary | ICD-10-CM | POA: Diagnosis not present

## 2016-11-04 DIAGNOSIS — K219 Gastro-esophageal reflux disease without esophagitis: Secondary | ICD-10-CM | POA: Diagnosis not present

## 2016-11-04 DIAGNOSIS — L255 Unspecified contact dermatitis due to plants, except food: Secondary | ICD-10-CM | POA: Diagnosis not present

## 2016-11-04 DIAGNOSIS — E663 Overweight: Secondary | ICD-10-CM | POA: Diagnosis not present

## 2016-11-04 DIAGNOSIS — E063 Autoimmune thyroiditis: Secondary | ICD-10-CM | POA: Diagnosis not present

## 2016-11-23 DIAGNOSIS — Z85828 Personal history of other malignant neoplasm of skin: Secondary | ICD-10-CM | POA: Diagnosis not present

## 2016-11-23 DIAGNOSIS — L570 Actinic keratosis: Secondary | ICD-10-CM | POA: Diagnosis not present

## 2016-12-03 ENCOUNTER — Other Ambulatory Visit (INDEPENDENT_AMBULATORY_CARE_PROVIDER_SITE_OTHER): Payer: Self-pay | Admitting: Internal Medicine

## 2017-01-04 DIAGNOSIS — Z6825 Body mass index (BMI) 25.0-25.9, adult: Secondary | ICD-10-CM | POA: Diagnosis not present

## 2017-01-04 DIAGNOSIS — M47812 Spondylosis without myelopathy or radiculopathy, cervical region: Secondary | ICD-10-CM | POA: Diagnosis not present

## 2017-01-04 DIAGNOSIS — Z125 Encounter for screening for malignant neoplasm of prostate: Secondary | ICD-10-CM | POA: Diagnosis not present

## 2017-01-04 DIAGNOSIS — M1991 Primary osteoarthritis, unspecified site: Secondary | ICD-10-CM | POA: Diagnosis not present

## 2017-01-04 DIAGNOSIS — E785 Hyperlipidemia, unspecified: Secondary | ICD-10-CM | POA: Diagnosis not present

## 2017-01-04 DIAGNOSIS — Z1389 Encounter for screening for other disorder: Secondary | ICD-10-CM | POA: Diagnosis not present

## 2017-01-04 DIAGNOSIS — K219 Gastro-esophageal reflux disease without esophagitis: Secondary | ICD-10-CM | POA: Diagnosis not present

## 2017-01-15 DIAGNOSIS — Z6825 Body mass index (BMI) 25.0-25.9, adult: Secondary | ICD-10-CM | POA: Diagnosis not present

## 2017-01-15 DIAGNOSIS — E663 Overweight: Secondary | ICD-10-CM | POA: Diagnosis not present

## 2017-01-15 DIAGNOSIS — N5312 Painful ejaculation: Secondary | ICD-10-CM | POA: Diagnosis not present

## 2017-01-15 DIAGNOSIS — R300 Dysuria: Secondary | ICD-10-CM | POA: Diagnosis not present

## 2017-02-01 ENCOUNTER — Encounter (INDEPENDENT_AMBULATORY_CARE_PROVIDER_SITE_OTHER): Payer: Self-pay

## 2017-02-01 ENCOUNTER — Ambulatory Visit (INDEPENDENT_AMBULATORY_CARE_PROVIDER_SITE_OTHER): Payer: Medicare Other | Admitting: Internal Medicine

## 2017-02-01 ENCOUNTER — Encounter (INDEPENDENT_AMBULATORY_CARE_PROVIDER_SITE_OTHER): Payer: Self-pay | Admitting: Internal Medicine

## 2017-02-01 VITALS — BP 160/90 | HR 68 | Temp 98.5°F | Ht 70.0 in | Wt 177.6 lb

## 2017-02-01 DIAGNOSIS — K5900 Constipation, unspecified: Secondary | ICD-10-CM | POA: Diagnosis not present

## 2017-02-01 NOTE — Progress Notes (Signed)
   Subjective:    Patient ID: Darryl Phillips, male    DOB: 1945-12-04, 71 y.o.   MRN: [id number]  HPI His last colonoscopy was in 2009 for bloody diarrhea.  Few diverticula in sigmoid colon. External hemorrhoids. Normal. Next colonoscopy in 2019. States for about 2 weeks he has had change in his BM.  He states he has been constipated.  He has had 3 episodes of having to take a stool softener. He states he he usually has a BM daily. States he had to disimpact himself recently. No problems at this time with his bowels.  He is eating better.     Review of Systems Past Medical History:  Diagnosis Date  . Cancer ([place])    Melanoma back 2009  . Glaucoma   . Shingles   . Thyroid disease     Past Surgical History:  Procedure Laterality Date  . cataract surgery     [facility] 2018. Also has a stent in his eye during surgery  . CHOLECYSTECTOMY     Dr. Arnoldo Morale  . melonoma     x 4 sugeries    Allergies  Allergen Reactions  . Azithromycin Nausea And Vomiting  . Tetracyclines & Related     Current Outpatient Medications on File Prior to Visit  Medication Sig Dispense Refill  . bismuth subsalicylate (PEPTO BISMOL) 262 MG/15ML suspension Take 30 mLs by mouth every 6 (six) hours as needed.    Marland Kitchen esomeprazole (NEXIUM) 40 MG capsule TAKE 1 CAPSULE BY MOUTH ONCE DAILY IN THE MORNING BEFORE  BREAKFAST 30 capsule 5  . levothyroxine (SYNTHROID, LEVOTHROID) 125 MCG tablet Take 125 mcg by mouth daily before breakfast.    . saw palmetto 160 MG capsule Take 160 mg by mouth 2 (two) times daily.    . timolol (BETIMOL) 0.5 % ophthalmic solution 1 drop 2 (two) times daily.    . travoprost, benzalkonium, (TRAVATAN) 0.004 % ophthalmic solution Place 1 drop into both eyes at bedtime.     No current facility-administered medications on file prior to visit.         Objective:   Physical Exam Blood pressure (!) 160/90, pulse 68, temperature 98.5 F (36.9 C), height 5\' 10"  (1.778 m), weight 177 lb  9.6 oz (80.6 kg).  Alert and oriented. Skin warm and dry. Oral mucosa is moist.   . Sclera anicteric, conjunctivae is pink. Thyroid not enlarged. No cervical lymphadenopathy. Lungs clear. Heart regular rate and rhythm.  Abdomen is soft. Bowel sounds are positive. No hepatomegaly. No abdominal masses felt. No tenderness.  No edema to lower extremities.          Assessment & Plan:  Constipation:  Stool softener daily.,  Will let me know in 2 weeks how he is doing

## 2017-02-01 NOTE — Patient Instructions (Addendum)
Stool softener daily.

## 2017-02-05 DIAGNOSIS — Z0001 Encounter for general adult medical examination with abnormal findings: Secondary | ICD-10-CM | POA: Diagnosis not present

## 2017-02-05 DIAGNOSIS — E663 Overweight: Secondary | ICD-10-CM | POA: Diagnosis not present

## 2017-02-05 DIAGNOSIS — R946 Abnormal results of thyroid function studies: Secondary | ICD-10-CM | POA: Diagnosis not present

## 2017-02-05 DIAGNOSIS — Z6825 Body mass index (BMI) 25.0-25.9, adult: Secondary | ICD-10-CM | POA: Diagnosis not present

## 2017-02-05 DIAGNOSIS — Z1389 Encounter for screening for other disorder: Secondary | ICD-10-CM | POA: Diagnosis not present

## 2017-02-05 DIAGNOSIS — E039 Hypothyroidism, unspecified: Secondary | ICD-10-CM | POA: Diagnosis not present

## 2017-02-12 ENCOUNTER — Ambulatory Visit (INDEPENDENT_AMBULATORY_CARE_PROVIDER_SITE_OTHER): Payer: Medicare Other

## 2017-02-12 ENCOUNTER — Ambulatory Visit (INDEPENDENT_AMBULATORY_CARE_PROVIDER_SITE_OTHER): Payer: Medicare Other | Admitting: Orthopedic Surgery

## 2017-02-12 ENCOUNTER — Encounter (INDEPENDENT_AMBULATORY_CARE_PROVIDER_SITE_OTHER): Payer: Self-pay | Admitting: Orthopedic Surgery

## 2017-02-12 DIAGNOSIS — M7541 Impingement syndrome of right shoulder: Secondary | ICD-10-CM

## 2017-02-12 DIAGNOSIS — G8929 Other chronic pain: Secondary | ICD-10-CM

## 2017-02-12 DIAGNOSIS — M25511 Pain in right shoulder: Secondary | ICD-10-CM | POA: Diagnosis not present

## 2017-02-12 MED ORDER — LIDOCAINE HCL 1 % IJ SOLN
5.0000 mL | INTRAMUSCULAR | Status: AC | PRN
  Administered 2017-02-12: 5 mL

## 2017-02-12 MED ORDER — METHYLPREDNISOLONE ACETATE 40 MG/ML IJ SUSP
40.0000 mg | INTRAMUSCULAR | Status: AC | PRN
  Administered 2017-02-12: 40 mg via INTRA_ARTICULAR

## 2017-02-12 MED ORDER — BUPIVACAINE HCL 0.5 % IJ SOLN
9.0000 mL | INTRAMUSCULAR | Status: AC | PRN
  Administered 2017-02-12: 9 mL via INTRA_ARTICULAR

## 2017-02-12 NOTE — Progress Notes (Signed)
Office Visit Note   Patient: Darryl Phillips           Date of Birth: 1945-08-20           MRN: [id number] Visit Date: 02/12/2017 Requested by: Sharilyn Sites, [address], [place] [address] PCP: Sharilyn Sites, MD  Subjective: Chief Complaint  Patient presents with  . Right Shoulder - Pain    HPI: Darryl Phillips is a 71 year old patient with right shoulder pain.  Denies a history of injury.  Pain going on for 8-10 years.  He states "I have bone spurs".  Previous injection has been done 10 years ago by family physician.  He is right-hand-dominant.  Hard for him to lay on the right-hand side but reports deltoid-type pain.  Denies any pain around the acromioclavicular joint.  The pain will wake him from sleep at night.  He takes ibuprofen as needed for symptoms.  Extension hurts his right shoulder.  He reports some weakness and occasional coarseness in the shoulder.  He does hear some grinding at night.  It has been worse over the last 4-6 weeks.              ROS: All systems reviewed are negative as they relate to the chief complaint within the history of present illness.  Patient denies  fevers or chills.   Assessment & Plan: Visit Diagnoses:  1. Chronic right shoulder pain   2. Impingement syndrome of right shoulder     Plan: Impression is right shoulder impingement bursitis.  Plan is right shoulder injection in the subacromial space today.  This is performed after sterile prep and drape.  Continue with nonweightbearing rotator cuff strengthening exercises.  Avoid overhead loading.  Follow-up as needed.  Follow-Up Instructions: Return if symptoms worsen or fail to improve.   Orders:  Orders Placed This Encounter  Procedures  . XR Shoulder Right   No orders of the defined types were placed in this encounter.     Procedures: Large Joint Inj: R subacromial bursa on 02/12/2017 12:31 PM Indications: diagnostic evaluation and pain Details: 18 G 1.5 in needle, posterior  approach  Arthrogram: No  Medications: 9 mL bupivacaine 0.5 %; 40 mg methylPREDNISolone acetate 40 MG/ML; 5 mL lidocaine 1 % Outcome: tolerated well, no immediate complications Procedure, treatment alternatives, risks and benefits explained, specific risks discussed. Consent was given by the patient. Immediately prior to procedure a time out was called to verify the correct patient, procedure, equipment, support staff and site/side marked as required. Patient was prepped and draped in the usual sterile fashion.       Clinical Data: No additional findings.  Objective: Vital Signs: There were no vitals taken for this visit.  Physical Exam:   Constitutional: Patient appears well-developed HEENT:  Head: Normocephalic Eyes:EOM are normal Neck: Normal range of motion Cardiovascular: Normal rate Pulmonary/chest: Effort normal Neurologic: Patient is alert Skin: Skin is warm Psychiatric: Patient has normal mood and affect    Ortho Exam: Orthopedic exam demonstrates good cervical spine range of motion 5 out of 5 grip EPL FPL interosseous wrist flexion-extension biceps triceps and deltoid strength.  Does have good rotator cuff strength on the right to isolated infraspinatus infraspinatus and super status testing.  Impingement signs positive on the right negative on the left no discrete acromioclavicular joint tenderness noted  Specialty Comments:  No specialty comments available.  Imaging: Xr Shoulder Right  Result Date: 02/12/2017 [facility] outlet and axillary lateral right shoulder reviewed.  Mild acromioclavicular  joint arthritis is present.  Shoulder is reduced.  No fracture or dislocations present.    PMFS History: Patient Active Problem List   Diagnosis Date Noted  . History of malignant melanoma 01/24/2014  . Irritable bowel syndrome 01/24/2014  . GERD (gastroesophageal reflux disease) 11/21/2013  . Unspecified hypothyroidism 11/21/2013  . Glaucoma 11/21/2013   Past  Medical History:  Diagnosis Date  . Cancer ([place])    Melanoma back 2009  . Glaucoma   . Shingles   . Thyroid disease     History reviewed. No pertinent family history.  Past Surgical History:  Procedure Laterality Date  . cataract surgery     [facility] 2018. Also has a stent in his eye during surgery  . CHOLECYSTECTOMY     Dr. Arnoldo Morale  . ESOPHAGOGASTRODUODENOSCOPY (EGD) N/A 12/14/2013   Performed by Rogene Houston, MD at [facility]     x 4 sugeries   Social History   Occupational History  . Not on file  Tobacco Use  . Smoking status: Former Smoker    Last attempt to quit: 01/23/1969    Years since quitting: 48.0  . Smokeless tobacco: Never Used  Substance and Sexual Activity  . Alcohol use: No    Alcohol/week: 0.0 oz  . Drug use: No  . Sexual activity: Not on file

## 2017-02-25 ENCOUNTER — Ambulatory Visit (INDEPENDENT_AMBULATORY_CARE_PROVIDER_SITE_OTHER): Payer: Medicare Other | Admitting: Internal Medicine

## 2017-05-10 DIAGNOSIS — H401111 Primary open-angle glaucoma, right eye, mild stage: Secondary | ICD-10-CM | POA: Diagnosis not present

## 2017-05-29 ENCOUNTER — Other Ambulatory Visit (INDEPENDENT_AMBULATORY_CARE_PROVIDER_SITE_OTHER): Payer: Self-pay | Admitting: Internal Medicine

## 2017-08-24 DIAGNOSIS — Z803 Family history of malignant neoplasm of breast: Secondary | ICD-10-CM | POA: Diagnosis not present

## 2017-08-24 DIAGNOSIS — E039 Hypothyroidism, unspecified: Secondary | ICD-10-CM | POA: Diagnosis not present

## 2017-08-24 DIAGNOSIS — Z8589 Personal history of malignant neoplasm of other organs and systems: Secondary | ICD-10-CM | POA: Diagnosis not present

## 2017-08-24 DIAGNOSIS — N529 Male erectile dysfunction, unspecified: Secondary | ICD-10-CM | POA: Diagnosis not present

## 2017-08-24 DIAGNOSIS — Z85820 Personal history of malignant melanoma of skin: Secondary | ICD-10-CM | POA: Diagnosis not present

## 2017-08-24 DIAGNOSIS — H409 Unspecified glaucoma: Secondary | ICD-10-CM | POA: Diagnosis not present

## 2017-08-24 DIAGNOSIS — R030 Elevated blood-pressure reading, without diagnosis of hypertension: Secondary | ICD-10-CM | POA: Diagnosis not present

## 2017-08-24 DIAGNOSIS — K580 Irritable bowel syndrome with diarrhea: Secondary | ICD-10-CM | POA: Diagnosis not present

## 2017-08-24 DIAGNOSIS — Z8249 Family history of ischemic heart disease and other diseases of the circulatory system: Secondary | ICD-10-CM | POA: Diagnosis not present

## 2017-08-24 DIAGNOSIS — Z85828 Personal history of other malignant neoplasm of skin: Secondary | ICD-10-CM | POA: Diagnosis not present

## 2017-09-13 DIAGNOSIS — Z6826 Body mass index (BMI) 26.0-26.9, adult: Secondary | ICD-10-CM | POA: Diagnosis not present

## 2017-09-13 DIAGNOSIS — S39012A Strain of muscle, fascia and tendon of lower back, initial encounter: Secondary | ICD-10-CM | POA: Diagnosis not present

## 2017-09-13 DIAGNOSIS — E663 Overweight: Secondary | ICD-10-CM | POA: Diagnosis not present

## 2017-09-13 DIAGNOSIS — Z1389 Encounter for screening for other disorder: Secondary | ICD-10-CM | POA: Diagnosis not present

## 2017-10-20 DIAGNOSIS — E663 Overweight: Secondary | ICD-10-CM | POA: Diagnosis not present

## 2017-10-20 DIAGNOSIS — R05 Cough: Secondary | ICD-10-CM | POA: Diagnosis not present

## 2017-10-20 DIAGNOSIS — Z6826 Body mass index (BMI) 26.0-26.9, adult: Secondary | ICD-10-CM | POA: Diagnosis not present

## 2017-10-28 ENCOUNTER — Encounter (INDEPENDENT_AMBULATORY_CARE_PROVIDER_SITE_OTHER): Payer: Self-pay | Admitting: *Deleted

## 2017-10-29 DIAGNOSIS — C434 Malignant melanoma of scalp and neck: Secondary | ICD-10-CM | POA: Diagnosis not present

## 2017-10-29 DIAGNOSIS — Z006 Encounter for examination for normal comparison and control in clinical research program: Secondary | ICD-10-CM | POA: Diagnosis not present

## 2017-10-29 DIAGNOSIS — K5790 Diverticulosis of intestine, part unspecified, without perforation or abscess without bleeding: Secondary | ICD-10-CM | POA: Diagnosis not present

## 2017-10-29 DIAGNOSIS — Z85820 Personal history of malignant melanoma of skin: Secondary | ICD-10-CM | POA: Diagnosis not present

## 2017-10-29 DIAGNOSIS — C439 Malignant melanoma of skin, unspecified: Secondary | ICD-10-CM | POA: Diagnosis not present

## 2017-10-29 DIAGNOSIS — Z1283 Encounter for screening for malignant neoplasm of skin: Secondary | ICD-10-CM | POA: Diagnosis not present

## 2017-11-08 DIAGNOSIS — R69 Illness, unspecified: Secondary | ICD-10-CM | POA: Diagnosis not present

## 2017-11-11 ENCOUNTER — Other Ambulatory Visit (INDEPENDENT_AMBULATORY_CARE_PROVIDER_SITE_OTHER): Payer: Self-pay | Admitting: *Deleted

## 2017-11-11 DIAGNOSIS — Z1211 Encounter for screening for malignant neoplasm of colon: Secondary | ICD-10-CM | POA: Insufficient documentation

## 2017-11-15 DIAGNOSIS — H401122 Primary open-angle glaucoma, left eye, moderate stage: Secondary | ICD-10-CM | POA: Diagnosis not present

## 2017-11-15 DIAGNOSIS — H401111 Primary open-angle glaucoma, right eye, mild stage: Secondary | ICD-10-CM | POA: Diagnosis not present

## 2017-11-27 ENCOUNTER — Other Ambulatory Visit (INDEPENDENT_AMBULATORY_CARE_PROVIDER_SITE_OTHER): Payer: Self-pay | Admitting: Internal Medicine

## 2018-01-31 ENCOUNTER — Telehealth (INDEPENDENT_AMBULATORY_CARE_PROVIDER_SITE_OTHER): Payer: Self-pay | Admitting: *Deleted

## 2018-01-31 ENCOUNTER — Encounter (INDEPENDENT_AMBULATORY_CARE_PROVIDER_SITE_OTHER): Payer: Self-pay | Admitting: *Deleted

## 2018-01-31 MED ORDER — SUPREP BOWEL PREP KIT 17.5-3.13-1.6 GM/177ML PO SOLN: 1.0000 | Freq: Once | ORAL | 0 refills | Status: AC

## 2018-01-31 NOTE — Telephone Encounter (Signed)
Patient needs suprep 

## 2018-01-31 NOTE — Telephone Encounter (Signed)
agree

## 2018-01-31 NOTE — Telephone Encounter (Signed)
Referring MD/PCP: golding   Procedure: tcs  Reason/Indication:  screening  Has patient had this procedure before?  Yes, 10 yrs ago  If so, when, by whom and where?    Is there a family history of colon cancer?  no  Who?  What age when diagnosed?    Is patient diabetic?   no      Does patient have prosthetic heart valve or mechanical valve?  no  Do you have a pacemaker?  no  Has patient ever had endocarditis? no  Has patient had joint replacement within last 12 months?  no  Is patient constipated or do they take laxatives? no  Does patient have a history of alcohol/drug use?  no  Is patient on blood thinner such as Coumadin, Plavix and/or Aspirin? yes  Medications: levothyroxine 125 mcg daily, esomeprazole 40 mg daily, dorzol/timol 1 drop bid, travatanz 1 drop at bedtime, saw palmetto 160 mg bid, acidophilis 5 mg daily  Allergies: see epic  Medication Adjustment per Dr Lindi Adie, NP:   Procedure date & time: 03/02/18 at 730

## 2018-01-31 NOTE — Telephone Encounter (Signed)
I don't see where he is on a blood thinner

## 2018-01-31 NOTE — Telephone Encounter (Signed)
Patient isn't on blood thinner -- that is a typo

## 2018-02-01 ENCOUNTER — Ambulatory Visit (INDEPENDENT_AMBULATORY_CARE_PROVIDER_SITE_OTHER): Payer: Medicare HMO | Admitting: Internal Medicine

## 2018-02-01 ENCOUNTER — Encounter (INDEPENDENT_AMBULATORY_CARE_PROVIDER_SITE_OTHER): Payer: Self-pay | Admitting: Internal Medicine

## 2018-02-01 VITALS — BP 140/84 | HR 60 | Temp 98.5°F | Ht 70.0 in | Wt 185.7 lb

## 2018-02-01 DIAGNOSIS — K219 Gastro-esophageal reflux disease without esophagitis: Secondary | ICD-10-CM

## 2018-02-01 NOTE — Progress Notes (Signed)
   Subjective:    Patient ID: Darryl Phillips, male    DOB: Jan 04, 1946, 72 y.o.   MRN: [id number]  HPI Here today for f/u. Last seen in November of 2018. Hx of constipation.  Scheduled for a colonoscopy in December of this year (screening). He tells me he is doing good. He says he has more diarrhea than constipation.  He is having a BM x 1 a day. No melena or BRRB. Appetite is good. Wt gain of 8 lbs since his last visit. He is staying busy. He plays golf once a week.    Review of Systems Past Medical History:  Diagnosis Date  . Cancer ([place])    Melanoma back 2009  . Glaucoma   . Shingles   . Thyroid disease     Past Surgical History:  Procedure Laterality Date  . cataract surgery     [facility] 2018. Also has a stent in his eye during surgery  . CHOLECYSTECTOMY     Dr. Arnoldo Morale  . ESOPHAGOGASTRODUODENOSCOPY N/A 12/14/2013   Procedure: ESOPHAGOGASTRODUODENOSCOPY (EGD);  Surgeon: Rogene Houston, MD;  Location: [facility];  Service: Endoscopy;  Laterality: N/A;  300  . melonoma     x 4 sugeries    Allergies  Allergen Reactions  . Azithromycin Nausea And Vomiting  . Tetracyclines & Related     Current Outpatient Medications on File Prior to Visit  Medication Sig Dispense Refill  . bismuth subsalicylate (PEPTO BISMOL) 262 MG/15ML suspension Take 30 mLs by mouth every 6 (six) hours as needed.    Marland Kitchen esomeprazole (NEXIUM) 40 MG capsule TAKE 1 CAPSULE BY MOUTH ONCE DAILY IN THE MORNING BEFORE BREAKFAST 90 capsule 3  . levothyroxine (SYNTHROID, LEVOTHROID) 125 MCG tablet Take 125 mcg by mouth daily before breakfast.    . saw palmetto 160 MG capsule Take 160 mg by mouth 2 (two) times daily.    . timolol (BETIMOL) 0.5 % ophthalmic solution 1 drop 2 (two) times daily.    . travoprost, benzalkonium, (TRAVATAN) 0.004 % ophthalmic solution Place 1 drop into both eyes at bedtime.     No current facility-administered medications on file prior to visit.         Objective:   Physical Exam Blood pressure 140/84, pulse 60, temperature 98.5 F (36.9 C), height 5\' 10"  (1.778 m), weight 185 lb 11.2 oz (84.2 kg). Alert and oriented. Skin warm and dry. Oral mucosa is moist.   . Sclera anicteric, conjunctivae is pink. Thyroid not enlarged. No cervical lymphadenopathy. Lungs clear. Heart regular rate and rhythm.  Abdomen is soft. Bowel sounds are positive. No hepatomegaly. No abdominal masses felt. No tenderness.  No edema to lower extremities.          Assessment & Plan:  Diarrhea. He is scheduled for a colonoscopy in December. OV in 1 year.

## 2018-02-15 DIAGNOSIS — E781 Pure hyperglyceridemia: Secondary | ICD-10-CM | POA: Diagnosis not present

## 2018-02-15 DIAGNOSIS — Z23 Encounter for immunization: Secondary | ICD-10-CM | POA: Diagnosis not present

## 2018-02-15 DIAGNOSIS — I10 Essential (primary) hypertension: Secondary | ICD-10-CM | POA: Diagnosis not present

## 2018-02-15 DIAGNOSIS — E039 Hypothyroidism, unspecified: Secondary | ICD-10-CM | POA: Diagnosis not present

## 2018-02-15 DIAGNOSIS — Z1389 Encounter for screening for other disorder: Secondary | ICD-10-CM | POA: Diagnosis not present

## 2018-02-15 DIAGNOSIS — E890 Postprocedural hypothyroidism: Secondary | ICD-10-CM | POA: Diagnosis not present

## 2018-02-15 DIAGNOSIS — K219 Gastro-esophageal reflux disease without esophagitis: Secondary | ICD-10-CM | POA: Diagnosis not present

## 2018-02-15 DIAGNOSIS — Z0001 Encounter for general adult medical examination with abnormal findings: Secondary | ICD-10-CM | POA: Diagnosis not present

## 2018-02-15 DIAGNOSIS — Z6826 Body mass index (BMI) 26.0-26.9, adult: Secondary | ICD-10-CM | POA: Diagnosis not present

## 2018-02-28 ENCOUNTER — Encounter (INDEPENDENT_AMBULATORY_CARE_PROVIDER_SITE_OTHER): Payer: Self-pay | Admitting: *Deleted

## 2018-02-28 ENCOUNTER — Telehealth (INDEPENDENT_AMBULATORY_CARE_PROVIDER_SITE_OTHER): Payer: Self-pay | Admitting: *Deleted

## 2018-02-28 MED ORDER — SUPREP BOWEL PREP KIT 17.5-3.13-1.6 GM/177ML PO SOLN: 1.0000 | Freq: Once | ORAL | 0 refills | Status: AC

## 2018-02-28 NOTE — Telephone Encounter (Signed)
Patient needs suprep 

## 2018-03-01 DIAGNOSIS — R69 Illness, unspecified: Secondary | ICD-10-CM | POA: Diagnosis not present

## 2018-03-28 ENCOUNTER — Encounter (HOSPITAL_COMMUNITY)
Admission: RE | Disposition: A | Discharge status: Home / Self Care | Payer: Self-pay | Source: Ambulatory Visit | Attending: Internal Medicine

## 2018-03-28 ENCOUNTER — Ambulatory Visit (HOSPITAL_COMMUNITY)
Admission: RE | Admit: 2018-03-28 | Discharge: 2018-03-28 | Disposition: A | Discharge status: Home / Self Care | Payer: Medicare HMO | Source: Ambulatory Visit | Attending: Internal Medicine | Admitting: Internal Medicine

## 2018-03-28 ENCOUNTER — Other Ambulatory Visit: Payer: Self-pay

## 2018-03-28 ENCOUNTER — Encounter (HOSPITAL_COMMUNITY): Payer: Self-pay | Admitting: *Deleted

## 2018-03-28 DIAGNOSIS — Z87891 Personal history of nicotine dependence: Secondary | ICD-10-CM | POA: Insufficient documentation

## 2018-03-28 DIAGNOSIS — K644 Residual hemorrhoidal skin tags: Secondary | ICD-10-CM | POA: Diagnosis not present

## 2018-03-28 DIAGNOSIS — D125 Benign neoplasm of sigmoid colon: Secondary | ICD-10-CM | POA: Insufficient documentation

## 2018-03-28 DIAGNOSIS — Z79899 Other long term (current) drug therapy: Secondary | ICD-10-CM | POA: Insufficient documentation

## 2018-03-28 DIAGNOSIS — Z85820 Personal history of malignant melanoma of skin: Secondary | ICD-10-CM | POA: Insufficient documentation

## 2018-03-28 DIAGNOSIS — H409 Unspecified glaucoma: Secondary | ICD-10-CM | POA: Insufficient documentation

## 2018-03-28 DIAGNOSIS — Z1211 Encounter for screening for malignant neoplasm of colon: Secondary | ICD-10-CM | POA: Insufficient documentation

## 2018-03-28 DIAGNOSIS — E079 Disorder of thyroid, unspecified: Secondary | ICD-10-CM | POA: Insufficient documentation

## 2018-03-28 DIAGNOSIS — K5730 Diverticulosis of large intestine without perforation or abscess without bleeding: Secondary | ICD-10-CM | POA: Insufficient documentation

## 2018-03-28 DIAGNOSIS — Z79890 Hormone replacement therapy (postmenopausal): Secondary | ICD-10-CM | POA: Insufficient documentation

## 2018-03-28 HISTORY — PX: COLONOSCOPY: SHX5424

## 2018-03-28 HISTORY — PX: POLYPECTOMY: SHX5525

## 2018-03-28 SURGERY — COLONOSCOPY
Anesthesia: Moderate Sedation

## 2018-03-28 MED ORDER — MIDAZOLAM HCL 5 MG/5ML IJ SOLN
INTRAMUSCULAR | Status: AC
  Filled 2018-03-28: qty 10

## 2018-03-28 MED ORDER — MEPERIDINE HCL 50 MG/ML IJ SOLN
INTRAMUSCULAR | Status: AC
  Filled 2018-03-28: qty 1

## 2018-03-28 MED ORDER — STERILE WATER FOR IRRIGATION IR SOLN
Status: DC | PRN
  Administered 2018-03-28: 10:00:00

## 2018-03-28 MED ORDER — SODIUM CHLORIDE 0.9 % IV SOLN
INTRAVENOUS | Status: DC
  Administered 2018-03-28: 10:00:00 via INTRAVENOUS

## 2018-03-28 MED ORDER — MEPERIDINE HCL 50 MG/ML IJ SOLN
INTRAMUSCULAR | Status: DC | PRN
  Administered 2018-03-28 (×2): 25 mg via INTRAVENOUS

## 2018-03-28 MED ORDER — MIDAZOLAM HCL 5 MG/5ML IJ SOLN
INTRAMUSCULAR | Status: DC | PRN
  Administered 2018-03-28 (×2): 2 mg via INTRAVENOUS
  Administered 2018-03-28: 1 mg via INTRAVENOUS

## 2018-03-28 NOTE — Discharge Instructions (Signed)
No aspirin or NSAIDs for 24 hours. Resume usual medications as before. High-fiber diet. No driving for 24 hours. Physician will call with biopsy results.  PATIENT INSTRUCTIONS POST-ANESTHESIA  IMMEDIATELY FOLLOWING SURGERY:  Do not drive or operate machinery for the first twenty four hours after surgery.  Do not make any important decisions for twenty four hours after surgery or while taking narcotic pain medications or sedatives.  If you develop intractable nausea and vomiting or a severe headache please notify your doctor immediately.  FOLLOW-UP:  Please make an appointment with your surgeon as instructed. You do not need to follow up with anesthesia unless specifically instructed to do so.  WOUND CARE INSTRUCTIONS (if applicable):  Keep a dry clean dressing on the anesthesia/puncture wound site if there is drainage.  Once the wound has quit draining you may leave it open to air.  Generally you should leave the bandage intact for twenty four hours unless there is drainage.  If the epidural site drains for more than 36-48 hours please call the anesthesia department.  QUESTIONS?:  Please feel free to call your physician or the hospital operator if you have any questions, and they will be happy to assist you.       Colonoscopy, Adult, Care After This sheet gives you information about how to care for yourself after your procedure. Your doctor may also give you more specific instructions. If you have problems or questions, call your doctor. What can I expect after the procedure? After the procedure, it is common to have:  A small amount of blood in your poop for 24 hours.  Some gas.  Mild cramping or bloating in your belly. Follow these instructions at home: General instructions  For the first 24 hours after the procedure: ? Do not drive or use machinery. ? Do not sign important documents. ? Do not drink alcohol. ? Do your daily activities more slowly than normal. ? Eat foods that  are soft and easy to digest.  Take over-the-counter or prescription medicines only as told by your doctor. To help cramping and bloating:   Try walking around.  Put heat on your belly (abdomen) as told by your doctor. Use a heat source that your doctor recommends, such as a moist heat pack or a heating pad. ? Put a towel between your skin and the heat source. ? Leave the heat on for 20-30 minutes. ? Remove the heat if your skin turns bright red. This is especially important if you cannot feel pain, heat, or cold. You can get burned. Eating and drinking   Drink enough fluid to keep your pee (urine) clear or pale yellow.  Return to your normal diet as told by your doctor. Avoid heavy or fried foods that are hard to digest.  Avoid drinking alcohol for as long as told by your doctor. Contact a doctor if:  You have blood in your poop (stool) 2-3 days after the procedure. Get help right away if:  You have more than a small amount of blood in your poop.  You see large clumps of tissue (blood clots) in your poop.  Your belly is swollen.  You feel sick to your stomach (nauseous).  You throw up (vomit).  You have a fever.  You have belly pain that gets worse, and medicine does not help your pain. Summary  After the procedure, it is common to have a small amount of blood in your poop. You may also have mild cramping and bloating in  your belly.  For the first 24 hours after the procedure, do not drive or use machinery, do not sign important documents, and do not drink alcohol.  Get help right away if you have a lot of blood in your poop, feel sick to your stomach, have a fever, or have more belly pain. This information is not intended to replace advice given to you by your health care provider. Make sure you discuss any questions you have with your health care provider. Document Released: 04/18/2010 Document Revised: 01/14/2017 Document Reviewed: 12/09/2015 Elsevier Interactive  Patient Education  2019 [employer].    Colon Polyps  Polyps are tissue growths inside the body. Polyps can grow in many places, including the large intestine (colon). A polyp may be a round bump or a mushroom-shaped growth. You could have one polyp or several. Most colon polyps are noncancerous (benign). However, some colon polyps can become cancerous over time. Finding and removing the polyps early can help prevent this. What are the causes? The exact cause of colon polyps is not known. What increases the risk? You are more likely to develop this condition if you:  Have a family history of colon cancer or colon polyps.  Are older than 71 or older than 45 if you are African American.  Have inflammatory bowel disease, such as ulcerative colitis or Crohn's disease.  Have certain hereditary conditions, such as: ? Familial adenomatous polyposis. ? Lynch syndrome. ? Turcot syndrome. ? Peutz-Jeghers syndrome.  Are overweight.  Smoke cigarettes.  Do not get enough exercise.  Drink too much alcohol.  Eat a diet that is high in fat and red meat and low in fiber.  Had childhood cancer that was treated with abdominal radiation. What are the signs or symptoms? Most polyps do not cause symptoms. If you have symptoms, they may include:  Blood coming from your rectum when having a bowel movement.  Blood in your stool. The stool may look dark red or black.  Abdominal pain.  A change in bowel habits, such as constipation or diarrhea. How is this diagnosed? This condition is diagnosed with a colonoscopy. This is a procedure in which a lighted, flexible scope is inserted into the anus and then passed into the colon to examine the area. Polyps are sometimes found when a colonoscopy is done as part of routine cancer screening tests. How is this treated? Treatment for this condition involves removing any polyps that are found. Most polyps can be removed during a colonoscopy. Those  polyps will then be tested for cancer. Additional treatment may be needed depending on the results of testing. Follow these instructions at home: Lifestyle  Maintain a healthy weight, or lose weight if recommended by your health care provider.  Exercise every day or as told by your health care provider.  Do not use any products that contain nicotine or tobacco, such as cigarettes and e-cigarettes. If you need help quitting, ask your health care provider.  If you drink alcohol, limit how much you have: ? 0-1 drink a day for women. ? 0-2 drinks a day for men.  Be aware of how much alcohol is in your drink. In the [place] one drink equals one 12 oz bottle of beer (355 mL), one 5 oz glass of wine (148 mL), or one 1 oz shot of hard liquor (44 mL). Eating and drinking   Eat foods that are high in fiber, such as fruits, vegetables, and whole grains.  Eat foods that are high in  calcium and vitamin D, such as milk, cheese, yogurt, eggs, liver, fish, and broccoli.  Limit foods that are high in fat, such as fried foods and desserts.  Limit the amount of red meat and processed meat you eat, such as hot dogs, sausage, bacon, and lunch meats. General instructions  Keep all follow-up visits as told by your health care provider. This is important. ? This includes having regularly scheduled colonoscopies. ? Talk to your health care provider about when you need a colonoscopy. Contact a health care provider if:  You have new or worsening bleeding during a bowel movement.  You have new or increased blood in your stool.  You have a change in bowel habits.  You lose weight for no known reason. Summary  Polyps are tissue growths inside the body. Polyps can grow in many places, including the colon.  Most colon polyps are noncancerous (benign), but some can become cancerous over time.  This condition is diagnosed with a colonoscopy.  Treatment for this condition involves removing any polyps that  are found. Most polyps can be removed during a colonoscopy. This information is not intended to replace advice given to you by your health care provider. Make sure you discuss any questions you have with your health care provider. Document Released: 12/11/2003 Document Revised: 07/01/2017 Document Reviewed: 07/01/2017 Elsevier Interactive Patient Education  2019 [employer].   Diverticulosis  Diverticulosis is a condition that develops when small pouches (diverticula) form in the wall of the large intestine (colon). The colon is where water is absorbed and stool is formed. The pouches form when the inside layer of the colon pushes through weak spots in the outer layers of the colon. You may have a few pouches or many of them. What are the causes? The cause of this condition is not known. What increases the risk? The following factors may make you more likely to develop this condition:  Being older than age 47. Your risk for this condition increases with age. Diverticulosis is rare among people younger than age 100. By age 51, many people have it.  Eating a low-fiber diet.  Having frequent constipation.  Being overweight.  Not getting enough exercise.  Smoking.  Taking over-the-counter pain medicines, like aspirin and ibuprofen.  Having a family history of diverticulosis. What are the signs or symptoms? In most people, there are no symptoms of this condition. If you do have symptoms, they may include:  Bloating.  Cramps in the abdomen.  Constipation or diarrhea.  Pain in the lower left side of the abdomen. How is this diagnosed? This condition is most often diagnosed during an exam for other colon problems. Because diverticulosis usually has no symptoms, it often cannot be diagnosed independently. This condition may be diagnosed by:  Using a flexible scope to examine the colon (colonoscopy).  Taking an X-ray of the colon after dye has been put into the colon (barium  enema).  Doing a CT scan. How is this treated? You may not need treatment for this condition if you have never developed an infection related to diverticulosis. If you have had an infection before, treatment may include:  Eating a high-fiber diet. This may include eating more fruits, vegetables, and grains.  Taking a fiber supplement.  Taking a live bacteria supplement (probiotic).  Taking medicine to relax your colon.  Taking antibiotic medicines. Follow these instructions at home:  Drink 6-8 glasses of water or more each day to prevent constipation.  Try not to strain when  you have a bowel movement.  If you have had an infection before: ? Eat more fiber as directed by your health care provider or your diet and nutrition specialist (dietitian). ? Take a fiber supplement or probiotic, if your health care provider approves.  Take over-the-counter and prescription medicines only as told by your health care provider.  If you were prescribed an antibiotic, take it as told by your health care provider. Do not stop taking the antibiotic even if you start to feel better.  Keep all follow-up visits as told by your health care provider. This is important. Contact a health care provider if:  You have pain in your abdomen.  You have bloating.  You have cramps.  You have not had a bowel movement in 3 days. Get help right away if:  Your pain gets worse.  Your bloating becomes very bad.  You have a fever or chills, and your symptoms suddenly get worse.  You vomit.  You have bowel movements that are bloody or black.  You have bleeding from your rectum. Summary  Diverticulosis is a condition that develops when small pouches (diverticula) form in the wall of the large intestine (colon).  You may have a few pouches or many of them.  This condition is most often diagnosed during an exam for other colon problems.  If you have had an infection related to diverticulosis,  treatment may include increasing the fiber in your diet, taking supplements, or taking medicines. This information is not intended to replace advice given to you by your health care provider. Make sure you discuss any questions you have with your health care provider. Document Released: 12/12/2003 Document Revised: 02/03/2016 Document Reviewed: 02/03/2016 Elsevier Interactive Patient Education  2019 [employer]   Hemorrhoids Hemorrhoids are swollen veins that may develop:  In the butt (rectum). These are called internal hemorrhoids.  Around the opening of the butt (anus). These are called external hemorrhoids. Hemorrhoids can cause pain, itching, or bleeding. Most of the time, they do not cause serious problems. They usually get better with diet changes, lifestyle changes, and other home treatments. What are the causes? This condition may be caused by:  Having trouble pooping (constipation).  Pushing hard (straining) to poop.  Watery poop (diarrhea).  Pregnancy.  Being very overweight (obese).  Sitting for long periods of time.  Heavy lifting or other activity that causes you to strain.  Anal sex.  Riding a bike for a long period of time. What are the signs or symptoms? Symptoms of this condition include:  Pain.  Itching or soreness in the butt.  Bleeding from the butt.  Leaking poop.  Swelling in the area.  One or more lumps around the opening of your butt. How is this diagnosed? A doctor can often diagnose this condition by looking at the affected area. The doctor may also:  Do an exam that involves feeling the area with a gloved hand (digital rectal exam).  Examine the area inside your butt using a small tube (anoscope).  Order blood tests. This may be done if you have lost a lot of blood.  Have you get a test that involves looking inside the colon using a flexible tube with a camera on the end (sigmoidoscopy or colonoscopy). How is this treated? This  condition can usually be treated at home. Your doctor may tell you to change what you eat, make lifestyle changes, or try home treatments. If these do not help, procedures can be done to  remove the hemorrhoids or make them smaller. These may involve:  Placing rubber bands at the base of the hemorrhoids to cut off their blood supply.  Injecting medicine into the hemorrhoids to shrink them.  Shining a type of light energy onto the hemorrhoids to cause them to fall off.  Doing surgery to remove the hemorrhoids or cut off their blood supply. Follow these instructions at home: Eating and drinking   Eat foods that have a lot of fiber in them. These include whole grains, beans, nuts, fruits, and vegetables.  Ask your doctor about taking products that have added fiber (fibersupplements).  Reduce the amount of fat in your diet. You can do this by: ? Eating low-fat dairy products. ? Eating less red meat. ? Avoiding processed foods.  Drink enough fluid to keep your pee (urine) pale yellow. Managing pain and swelling   Take a warm-water bath (sitz bath) for 20 minutes to ease pain. Do this 3-4 times a day. You may do this in a bathtub or using a portable sitz bath that fits over the toilet.  If told, put ice on the painful area. It may be helpful to use ice between your warm baths. ? Put ice in a plastic bag. ? Place a towel between your skin and the bag. ? Leave the ice on for 20 minutes, 2-3 times a day. General instructions  Take over-the-counter and prescription medicines only as told by your doctor. ? Medicated creams and medicines may be used as told.  Exercise often. Ask your doctor how much and what kind of exercise is best for you.  Go to the bathroom when you have the urge to poop. Do not wait.  Avoid pushing too hard when you poop.  Keep your butt dry and clean. Use wet toilet paper or moist towelettes after pooping.  Do not sit on the toilet for a long time.  Keep all  follow-up visits as told by your doctor. This is important. Contact a doctor if you:  Have pain and swelling that do not get better with treatment or medicine.  Have trouble pooping.  Cannot poop.  Have pain or swelling outside the area of the hemorrhoids. Get help right away if you have:  Bleeding that will not stop. Summary  Hemorrhoids are swollen veins in the butt or around the opening of the butt.  They can cause pain, itching, or bleeding.  Eat foods that have a lot of fiber in them. These include whole grains, beans, nuts, fruits, and vegetables.  Take a warm-water bath (sitz bath) for 20 minutes to ease pain. Do this 3-4 times a day. This information is not intended to replace advice given to you by your health care provider. Make sure you discuss any questions you have with your health care provider. Document Released: 12/24/2007 Document Revised: 08/05/2017 Document Reviewed: 08/05/2017 Elsevier Interactive Patient Education  2019 [employer].

## 2018-03-28 NOTE — Op Note (Signed)
[facility] Patient Name: Darryl Phillips Procedure Date: 03/28/2018 9:48 AM MRN: [id number] Date of Birth: 1945-08-24 Attending MD: Hildred Laser , MD CSN: [id number] Age: 72 Admit Type: Outpatient Procedure:                Colonoscopy Indications:              Screening for colorectal malignant neoplasm Providers:                Hildred Laser, MD, Otis Peak B. Sharon Seller, RN, Randa Spike, Technician Referring MD:             Halford Chessman, MD Medicines:                Meperidine 50 mg IV, Midazolam 5 mg IV Complications:            No immediate complications. Estimated Blood Loss:     Estimated blood loss was minimal. Procedure:                Pre-Anesthesia Assessment:                           - Prior to the procedure, a History and Physical                            was performed, and patient medications and                            allergies were reviewed. The patient's tolerance of                            previous anesthesia was also reviewed. The risks                            and benefits of the procedure and the sedation                            options and risks were discussed with the patient.                            All questions were answered, and informed consent                            was obtained. Prior Anticoagulants: The patient has                            taken no previous anticoagulant or antiplatelet                            agents. ASA Grade Assessment: II - A patient with                            mild systemic disease. After reviewing the risks  and benefits, the patient was deemed in                            satisfactory condition to undergo the procedure.                           After obtaining informed consent, the colonoscope                            was passed under direct vision. Throughout the                            procedure, the patient's blood pressure, pulse,  and                            oxygen saturations were monitored continuously. The                            [id number]-[id number] ([id number]) scope was introduced through                            the anus and advanced to the the cecum, identified                            by appendiceal orifice and ileocecal valve. The                            colonoscopy was performed without difficulty. The                            patient tolerated the procedure well. The quality                            of the bowel preparation was adequate. The                            ileocecal valve, appendiceal orifice, and rectum                            were photographed. Scope In: 10:05:25 AM Scope Out: 10:25:24 AM Scope Withdrawal Time: 0 hours 15 minutes 59 seconds  Total Procedure Duration: 0 hours 19 minutes 59 seconds  Findings:      The perianal and digital rectal examinations were normal.      A single small-mouthed diverticulum was found in the transverse colon       and distal transverse colon.      Multiple small and medium-mouthed diverticula were found in the sigmoid       colon.      A 4 mm polyp was found in the distal sigmoid colon. The polyp was       sessile. The polyp was removed with a cold snare. Resection and       retrieval were complete.      External hemorrhoids were found during retroflexion. The hemorrhoids       were small. Impression:               -  Diverticulosis in the transverse colon and in the                            distal transverse colon.                           - Diverticulosis in the sigmoid colon.                           - One 4 mm polyp in the distal sigmoid colon,                            removed with a cold snare. Resected and retrieved.                           - External hemorrhoids. Moderate Sedation:      Moderate (conscious) sedation was administered by the endoscopy nurse       and supervised by the endoscopist. The following parameters were        monitored: oxygen saturation, heart rate, blood pressure, CO2       capnography and response to care. Total physician intraservice time was       25 minutes. Recommendation:           - Patient has a contact number available for                            emergencies. The signs and symptoms of potential                            delayed complications were discussed with the                            patient. Return to normal activities tomorrow.                            Written discharge instructions were provided to the                            patient.                           - High fiber diet today.                           - Continue present medications.                           - No aspirin, ibuprofen, naproxen, or other                            non-steroidal anti-inflammatory drugs for 1 day.                           - Await pathology results.                           -  Repeat colonoscopy is recommended. The                            colonoscopy date will be determined after pathology                            results from today's exam become available for                            review. Procedure Code(s):        --- Professional ---                           [phone], Colonoscopy, flexible; with removal of                            tumor(s), polyp(s), or other lesion(s) by snare                            technique                           99153, Moderate sedation; each additional 15                            minutes intraservice time                           G0500, Moderate sedation services provided by the                            same physician or other qualified health care                            professional performing a gastrointestinal                            endoscopic service that sedation supports,                            requiring the presence of an independent trained                            observer to assist in the monitoring of  the                            patient's level of consciousness and physiological                            status; initial 15 minutes of intra-service time;                            patient age 62 years or older (additional time may                            be  reported with [phone], as appropriate) Diagnosis Code(s):        --- Professional ---                           Z12.11, Encounter for screening for malignant                            neoplasm of colon                           K64.4, Residual hemorrhoidal skin tags                           D12.5, Benign neoplasm of sigmoid colon                           K57.30, Diverticulosis of large intestine without                            perforation or abscess without bleeding CPT copyright 2018 [employer]. All rights reserved. The codes documented in this report are preliminary and upon coder review may  be revised to meet current compliance requirements. Hildred Laser, MD Hildred Laser, MD 03/28/2018 10:35:31 AM This report has been signed electronically. Number of Addenda: 0

## 2018-03-28 NOTE — H&P (Signed)
Darryl Phillips is an 72 y.o. male.   Chief Complaint: Patient is here for colonoscopy. HPI: Patient 72 year old Caucasian male who is here for screening colonoscopy.  He has a history of IBS and may have a flareup every now and then triggered with certain foods.  He denies melena or rectal bleeding or abdominal pain.  He has good appetite and his weight has been stable.  Last colonoscopy was normal in August 2009. Personal history significant for malignant melanoma and he remains in remission.  He underwent surgery and immunotherapy for 3 years.  He finished immunotherapy in 2013. Family history is negative for CRC.  Past Medical History:  Diagnosis Date  . Cancer ([place])    Melanoma back 2009  . Glaucoma   . Shingles   . Thyroid disease     Past Surgical History:  Procedure Laterality Date  . cataract surgery     [facility] 2018. Also has a stent in his eye during surgery  . CHOLECYSTECTOMY     Dr. Arnoldo Morale  . ESOPHAGOGASTRODUODENOSCOPY N/A 12/14/2013   Procedure: ESOPHAGOGASTRODUODENOSCOPY (EGD);  Surgeon: Rogene Houston, MD;  [facility];  Service: Endoscopy;  Laterality: N/A;  300  . melonoma     x 4 sugeries    History reviewed. No pertinent family history. Social History:  reports that he quit smoking about 49 years ago. He has never used smokeless tobacco. He reports that he does not drink alcohol or use drugs.  Allergies:  Allergies  Allergen Reactions  . Azithromycin Nausea And Vomiting  . Tetracyclines & Related     Skin on tongue peels off     Medications Prior to Admission  Medication Sig Dispense Refill  . bismuth subsalicylate (PEPTO BISMOL) 262 MG/15ML suspension Take 30 mLs by mouth every 6 (six) hours as needed for indigestion.     . dorzolamide-timolol (COSOPT) 22.3-6.8 MG/ML ophthalmic solution Place 1 drop into both eyes 2 (two) times daily.  5  . esomeprazole (NEXIUM) 40 MG capsule TAKE 1 CAPSULE BY MOUTH ONCE DAILY IN THE MORNING BEFORE  BREAKFAST (Patient taking differently: Take 40 mg by mouth daily. ) 90 capsule 3  . levothyroxine (SYNTHROID, LEVOTHROID) 125 MCG tablet Take 125 mcg by mouth daily before breakfast.    . Multiple Vitamin (MULTIVITAMIN WITH MINERALS) TABS tablet Take 1 tablet by mouth daily.    . Psyllium (METAMUCIL FIBER PO) Take 1 Dose by mouth daily.    . Saw Palmetto 450 MG CAPS Take 450 mg by mouth daily.    . travoprost, benzalkonium, (TRAVATAN) 0.004 % ophthalmic solution Place 1 drop into both eyes at bedtime.      No results found for this or any previous visit (from the past 48 hour(s)). No results found.  ROS  Blood pressure (!) 157/90, pulse 76, temperature 98.9 F (37.2 C), temperature source Oral, resp. rate 20, height 5\' 10"  (1.778 m), weight 82.6 kg, SpO2 100 %. Physical Exam  Constitutional: He appears well-developed and well-nourished.  HENT:  Mouth/Throat: Oropharynx is clear and moist.  Eyes: Conjunctivae are normal. No scleral icterus.  Neck:  Bilateral  neck scars. Prominent left sternoclavicular joint.  Cardiovascular: Normal rate, regular rhythm and normal heart sounds.  No murmur heard. Respiratory: Effort normal and breath sounds normal.  GI: Soft. He exhibits no distension and no mass.  Musculoskeletal:        General: No edema.  Neurological: He is alert.  Skin: Skin is warm and dry.  Assessment/Plan Average rescreening colonoscopy.   Hildred Laser, MD 03/28/2018, 9:54 AM

## 2018-04-04 ENCOUNTER — Encounter (HOSPITAL_COMMUNITY): Payer: Self-pay | Admitting: Internal Medicine

## 2018-04-29 DIAGNOSIS — R69 Illness, unspecified: Secondary | ICD-10-CM | POA: Diagnosis not present

## 2018-06-07 DIAGNOSIS — M5432 Sciatica, left side: Secondary | ICD-10-CM | POA: Diagnosis not present

## 2018-06-07 DIAGNOSIS — E663 Overweight: Secondary | ICD-10-CM | POA: Diagnosis not present

## 2018-06-07 DIAGNOSIS — Z1389 Encounter for screening for other disorder: Secondary | ICD-10-CM | POA: Diagnosis not present

## 2018-06-07 DIAGNOSIS — Z6826 Body mass index (BMI) 26.0-26.9, adult: Secondary | ICD-10-CM | POA: Diagnosis not present

## 2018-06-07 DIAGNOSIS — M5136 Other intervertebral disc degeneration, lumbar region: Secondary | ICD-10-CM | POA: Diagnosis not present

## 2018-06-29 DIAGNOSIS — M7551 Bursitis of right shoulder: Secondary | ICD-10-CM | POA: Diagnosis not present

## 2018-06-29 DIAGNOSIS — Z6826 Body mass index (BMI) 26.0-26.9, adult: Secondary | ICD-10-CM | POA: Diagnosis not present

## 2018-06-29 DIAGNOSIS — M6780 Other specified disorders of synovium and tendon, unspecified site: Secondary | ICD-10-CM | POA: Diagnosis not present

## 2018-06-29 DIAGNOSIS — E663 Overweight: Secondary | ICD-10-CM | POA: Diagnosis not present

## 2018-07-13 ENCOUNTER — Encounter (INDEPENDENT_AMBULATORY_CARE_PROVIDER_SITE_OTHER): Payer: Self-pay | Admitting: Family Medicine

## 2018-07-13 ENCOUNTER — Other Ambulatory Visit: Payer: Self-pay

## 2018-07-13 ENCOUNTER — Ambulatory Visit (INDEPENDENT_AMBULATORY_CARE_PROVIDER_SITE_OTHER): Payer: Medicare HMO | Admitting: Family Medicine

## 2018-07-13 DIAGNOSIS — G8929 Other chronic pain: Secondary | ICD-10-CM | POA: Diagnosis not present

## 2018-07-13 DIAGNOSIS — M25511 Pain in right shoulder: Secondary | ICD-10-CM | POA: Diagnosis not present

## 2018-07-13 MED ORDER — METHYLPREDNISOLONE ACETATE 40 MG/ML IJ SUSP: 40.0000 mg | Freq: Once | INTRAMUSCULAR | Status: AC

## 2018-07-13 NOTE — Progress Notes (Signed)
Office Visit Note   Patient: Darryl Phillips           Date of Birth: 02/07/1946           MRN: [id number] Visit Date: 07/13/2018 Requested by: Sharilyn Sites, [address], [place] [address] PCP: Sharilyn Sites, MD  Subjective: Chief Complaint  Patient presents with  . Right Shoulder - Pain    Intermittent pain flareups x years. Had a cortisone injection in 01/2017 by Dr. Marlou Sa - helped.    HPI: He is here with recurrent right shoulder pain.  Longstanding intermittent problems with his shoulder.  His last injection was about a year and a half ago.  It helped until a few months ago.  Pain on the lateral aspect, hurts to reach overhead or behind his back.  Ibuprofen helps a little bit.              ROS: No fevers or chills or respiratory symptoms.  All other systems were reviewed and are negative.  Objective: Vital Signs: There were no vitals taken for this visit.  Physical Exam:  General:  Alert and oriented, in no acute distress. Pulm:  Breathing unlabored. Psy:  Normal mood, congruent affect. Skin: No rash on his skin. Right shoulder: Full active range of motion, pain reaching overhead and behind the back.  Rotator cuff strength remains 5/5.  Tender in the lateral subacromial space.  Imaging: None today.  Assessment & Plan: 1.  Recurrent right shoulder pain, suspect impingement. -Discussed with patient, elected to inject again with steroid.  Follow-up as needed.     Procedures: Right shoulder subacromial injection: After sterile prep with Betadine, injected 5 cc 1% lidocaine without epinephrine and 40 mg methylprednisolone from lateral approach into the subacromial space.    PMFS History: Patient Active Problem List   Diagnosis Date Noted  . Special screening for malignant neoplasms, colon 11/11/2017  . History of malignant melanoma 01/24/2014  . Irritable bowel syndrome 01/24/2014  . GERD (gastroesophageal reflux disease) 11/21/2013  . Unspecified  hypothyroidism 11/21/2013  . Glaucoma 11/21/2013   Past Medical History:  Diagnosis Date  . Cancer ([place])    Melanoma back 2009  . Glaucoma   . Shingles   . Thyroid disease     History reviewed. No pertinent family history.  Past Surgical History:  Procedure Laterality Date  . cataract surgery     [facility] 2018. Also has a stent in his eye during surgery  . CHOLECYSTECTOMY     Dr. Arnoldo Morale  . COLONOSCOPY N/A 03/28/2018   Procedure: COLONOSCOPY;  Surgeon: Rogene Houston, MD;  [facility];  Service: Endoscopy;  Laterality: N/A;  830 - pt agreed to move per Tammy  . ESOPHAGOGASTRODUODENOSCOPY N/A 12/14/2013   Procedure: ESOPHAGOGASTRODUODENOSCOPY (EGD);  Surgeon: Rogene Houston, MD;  [facility];  Service: Endoscopy;  Laterality: N/A;  300  . melonoma     x 4 sugeries  . POLYPECTOMY  03/28/2018   Procedure: POLYPECTOMY;  Surgeon: Rogene Houston, MD;  [facility];  Service: Endoscopy;;  colon   Social History   Occupational History  . Not on file  Tobacco Use  . Smoking status: Former Smoker    Last attempt to quit: 01/23/1969    Years since quitting: 49.5  . Smokeless tobacco: Never Used  Substance and Sexual Activity  . Alcohol use: No    Alcohol/week: 0.0 standard drinks  . Drug use: No  .  Sexual activity: Not on file

## 2018-08-19 DIAGNOSIS — I10 Essential (primary) hypertension: Secondary | ICD-10-CM | POA: Diagnosis not present

## 2018-08-19 DIAGNOSIS — Z1389 Encounter for screening for other disorder: Secondary | ICD-10-CM | POA: Diagnosis not present

## 2018-08-19 DIAGNOSIS — M47816 Spondylosis without myelopathy or radiculopathy, lumbar region: Secondary | ICD-10-CM | POA: Diagnosis not present

## 2018-08-19 DIAGNOSIS — Z6825 Body mass index (BMI) 25.0-25.9, adult: Secondary | ICD-10-CM | POA: Diagnosis not present

## 2018-08-19 DIAGNOSIS — M545 Low back pain: Secondary | ICD-10-CM | POA: Diagnosis not present

## 2018-10-14 ENCOUNTER — Other Ambulatory Visit: Payer: Self-pay

## 2018-10-14 ENCOUNTER — Encounter: Payer: Self-pay | Admitting: Family Medicine

## 2018-10-14 ENCOUNTER — Ambulatory Visit (INDEPENDENT_AMBULATORY_CARE_PROVIDER_SITE_OTHER): Payer: Medicare HMO | Admitting: Family Medicine

## 2018-10-14 DIAGNOSIS — M25511 Pain in right shoulder: Secondary | ICD-10-CM

## 2018-10-14 DIAGNOSIS — G8929 Other chronic pain: Secondary | ICD-10-CM

## 2018-10-14 NOTE — Progress Notes (Signed)
Office Visit Note   Patient: Darryl Phillips           Date of Birth: 1945/10/18           MRN: [id number] Visit Date: 10/14/2018 Requested by: Sharilyn Sites, [address],  [place] [address] PCP: Sharilyn Sites, MD  Subjective: Chief Complaint  Patient presents with  . Right Shoulder - Pain    Requesting another cortisone injection (last one was 07/13/2018). Shoulder will wake him if he lies on the right side.    HPI: He is here with persistent right shoulder pain.  Most recent subacromial injection did not give that much relief.  Pain seems to be primarily on top of his shoulder.              ROS: No fevers or chills.  All other systems were reviewed and are negative.  Objective: Vital Signs: There were no vitals taken for this visit.  Physical Exam:  General:  Alert and oriented, in no acute distress. Pulm:  Breathing unlabored. Psy:  Normal mood, congruent affect. Skin: No rash on his skin. Right shoulder: Full range of motion, minimal pain with rotator cuff strength testing.  Today he is tender mainly over the [facility] joint.  He has some long head biceps tendon soreness as well.  Speeds test is negative.  Imaging: None today.  Assessment & Plan: 1.  Persistent right shoulder pain, possibly related to [facility] joint arthropathy. -Discussed options with him and elected to inject the [facility] joint under ultrasound guidance.  He will follow-up as needed.     Procedures: Right shoulder ultrasound-guided AC joint injection: After sterile prep with Betadine, injected 3 cc 1% lidocaine without epinephrine and 40 mg methylprednisolone into the AC joint.  He had excellent pain relief during the immediate anesthetic phase.  Follow-up as needed.    PMFS History: Patient Active Problem List   Diagnosis Date Noted  . Special screening for malignant neoplasms, colon 11/11/2017  . History of malignant melanoma 01/24/2014  . Irritable bowel syndrome 01/24/2014  . GERD  (gastroesophageal reflux disease) 11/21/2013  . Unspecified hypothyroidism 11/21/2013  . Glaucoma 11/21/2013   Past Medical History:  Diagnosis Date  . Cancer ([place])    Melanoma back 2009  . Glaucoma   . Shingles   . Thyroid disease     History reviewed. No pertinent family history.  Past Surgical History:  Procedure Laterality Date  . cataract surgery     [facility] 2018. Also has a stent in his eye during surgery  . CHOLECYSTECTOMY     Dr. Arnoldo Morale  . COLONOSCOPY N/A 03/28/2018   Procedure: COLONOSCOPY;  Surgeon: Rogene Houston, MD;  [facility];  Service: Endoscopy;  Laterality: N/A;  830 - pt agreed to move per Tammy  . ESOPHAGOGASTRODUODENOSCOPY N/A 12/14/2013   Procedure: ESOPHAGOGASTRODUODENOSCOPY (EGD);  Surgeon: Rogene Houston, MD;  [facility];  Service: Endoscopy;  Laterality: N/A;  300  . melonoma     x 4 sugeries  . POLYPECTOMY  03/28/2018   Procedure: POLYPECTOMY;  Surgeon: Rogene Houston, MD;  [facility];  Service: Endoscopy;;  colon   Social History   Occupational History  . Not on file  Tobacco Use  . Smoking status: Former Smoker    Quit date: 01/23/1969    Years since quitting: 49.7  . Smokeless tobacco: Never Used  Substance and Sexual Activity  . Alcohol use: No  Alcohol/week: 0.0 standard drinks  . Drug use: No  . Sexual activity: Not on file

## 2018-10-21 DIAGNOSIS — R69 Illness, unspecified: Secondary | ICD-10-CM | POA: Diagnosis not present

## 2018-11-15 DIAGNOSIS — Z6826 Body mass index (BMI) 26.0-26.9, adult: Secondary | ICD-10-CM | POA: Diagnosis not present

## 2018-11-15 DIAGNOSIS — M5136 Other intervertebral disc degeneration, lumbar region: Secondary | ICD-10-CM | POA: Diagnosis not present

## 2018-11-15 DIAGNOSIS — E663 Overweight: Secondary | ICD-10-CM | POA: Diagnosis not present

## 2018-11-15 DIAGNOSIS — M545 Low back pain: Secondary | ICD-10-CM | POA: Diagnosis not present

## 2018-11-23 DIAGNOSIS — H401122 Primary open-angle glaucoma, left eye, moderate stage: Secondary | ICD-10-CM | POA: Diagnosis not present

## 2018-11-23 DIAGNOSIS — H2511 Age-related nuclear cataract, right eye: Secondary | ICD-10-CM | POA: Diagnosis not present

## 2018-11-23 DIAGNOSIS — H401111 Primary open-angle glaucoma, right eye, mild stage: Secondary | ICD-10-CM | POA: Diagnosis not present

## 2018-11-28 ENCOUNTER — Ambulatory Visit (INDEPENDENT_AMBULATORY_CARE_PROVIDER_SITE_OTHER): Payer: Medicare HMO | Admitting: Nurse Practitioner

## 2018-11-28 ENCOUNTER — Other Ambulatory Visit: Payer: Self-pay

## 2018-11-28 ENCOUNTER — Encounter (INDEPENDENT_AMBULATORY_CARE_PROVIDER_SITE_OTHER): Payer: Self-pay | Admitting: Nurse Practitioner

## 2018-11-28 VITALS — BP 175/105 | HR 69 | Temp 98.2°F | Ht 69.0 in | Wt 182.0 lb

## 2018-11-28 DIAGNOSIS — K625 Hemorrhage of anus and rectum: Secondary | ICD-10-CM

## 2018-11-28 DIAGNOSIS — K921 Melena: Secondary | ICD-10-CM

## 2018-11-28 DIAGNOSIS — R5383 Other fatigue: Secondary | ICD-10-CM | POA: Diagnosis not present

## 2018-11-28 MED ORDER — HYDROCORTISONE ACETATE 25 MG RE SUPP: 25.0000 mg | Freq: Every day | RECTAL | 0 refills | Status: DC

## 2018-11-28 NOTE — Progress Notes (Signed)
Subjective:    Patient ID: Darryl Phillips, male    DOB: 04/11/1945, 73 y.o.   MRN: [id number]  HPI  Darryl Phillips is a 73 year old male with a past medical history significant for hypothyroidism, irritable bowel syndrome and malignant melanoma to his back with lymph node excision in 2009 and immunotherapy x 3 years. He remains in remission. He complains of having fatigue for the past month, falls asleep while watching TV. No chest pain or SOB. He went to the bathroom this morning, thought he was going to pass a BM but passed only bright red blood per the rectum. No severe rectal pain but had some minor burning discomfort. No abdominal pain. He described seeing bright red blood in the toilet water and on the toilet tissue, a darker red clot was on the toilet paper. No prior history of lower GI bleeding. He is passing a normal solid stool in the morning then 30 minutes later passes a watery to loose brown stool which has occurred for the past 2 weeks. He started drinking 1 to 2 ounces of beet juice once daily 2 weeks ago, he questions if this has triggered his change in bowel pattern. No fever, sweats or chills. No recent antibiotics. He takes Ibuprofen 200mg  one tab in the am with breakfast for arthritis pain. No other NSAIDs. No family history of colon cancer. His last colonoscopy was 03/28/2018 which identified diverticulosis to the transverse, distal transverse colon and sigmoid colon. A 33mm tubular adenomatous polyp was removed from the distal sigmoid colon. External hemorrhoids noted. He was advised to repeat a colonoscopy in 7 years.   Past Medical History:  Diagnosis Date  . Cancer ([place])    Melanoma back 2009  . Glaucoma   . Shingles   . Thyroid disease    Past Surgical History:  Procedure Laterality Date  . cataract surgery     [facility] 2018. Also has a stent in his eye during surgery  . CHOLECYSTECTOMY     Dr. Arnoldo Morale  . COLONOSCOPY N/A 03/28/2018   Procedure: COLONOSCOPY;   Surgeon: Rogene Houston, MD;  [facility];  Service: Endoscopy;  Laterality: N/A;  830 - pt agreed to move per Tammy  . ESOPHAGOGASTRODUODENOSCOPY N/A 12/14/2013   Procedure: ESOPHAGOGASTRODUODENOSCOPY (EGD);  Surgeon: Rogene Houston, MD;  [facility];  Service: Endoscopy;  Laterality: N/A;  300  . melonoma     x 4 sugeries  . POLYPECTOMY  03/28/2018   Procedure: POLYPECTOMY;  Surgeon: Rogene Houston, MD;  [facility];  Service: Endoscopy;;  colon    Past Medical History:  Diagnosis Date  . Cancer ([place])    Melanoma back 2009  . Glaucoma   . Shingles   . Thyroid disease    Past Surgical History:  Procedure Laterality Date  . cataract surgery     [facility] 2018. Also has a stent in his eye during surgery  . CHOLECYSTECTOMY     Dr. Arnoldo Morale  . COLONOSCOPY N/A 03/28/2018   Procedure: COLONOSCOPY;  Surgeon: Rogene Houston, MD;  [facility];  Service: Endoscopy;  Laterality: N/A;  830 - pt agreed to move per Tammy  . ESOPHAGOGASTRODUODENOSCOPY N/A 12/14/2013   Procedure: ESOPHAGOGASTRODUODENOSCOPY (EGD);  Surgeon: Rogene Houston, MD;  [facility];  Service: Endoscopy;  Laterality: N/A;  300  . melonoma     x 4 sugeries  . POLYPECTOMY  03/28/2018  Procedure: POLYPECTOMY;  Surgeon: Rogene Houston, MD;  [facility];  Service: Endoscopy;;  colon   Current Outpatient Medications on File Prior to Visit  Medication Sig Dispense Refill  . bismuth subsalicylate (PEPTO BISMOL) 262 MG/15ML suspension Take 30 mLs by mouth every 6 (six) hours as needed for indigestion.     . dorzolamide-timolol (COSOPT) 22.3-6.8 MG/ML ophthalmic solution Place 1 drop into both eyes 2 (two) times daily.  5  . esomeprazole (NEXIUM) 40 MG capsule TAKE 1 CAPSULE BY MOUTH ONCE DAILY IN THE MORNING BEFORE BREAKFAST (Patient taking differently: Take 40 mg by mouth daily. ) 90 capsule 3  . levothyroxine (SYNTHROID, LEVOTHROID) 125 MCG  tablet Take 125 mcg by mouth daily before breakfast.    . Multiple Vitamin (MULTIVITAMIN WITH MINERALS) TABS tablet Take 1 tablet by mouth daily.    . Saw Palmetto 450 MG CAPS Take 450 mg by mouth daily.    . travoprost, benzalkonium, (TRAVATAN) 0.004 % ophthalmic solution Place 1 drop into both eyes at bedtime.     Current Facility-Administered Medications on File Prior to Visit  Medication Dose Route Frequency Provider Last Rate Last Dose  . methylPREDNISolone acetate (DEPO-MEDROL) injection 40 mg  40 mg Intra-articular Once Hilts, Legrand Como, MD       Allergies  Allergen Reactions  . Azithromycin Nausea And Vomiting  . Tetracyclines & Related     Skin on tongue peels off    Review of Systems  See HPI, all other systems reviewed and are negative      Objective:   Physical Exam BP (!) 175/105   Pulse 69   Temp 98.2 F (36.8 C)   Ht 5\' 9"  (1.753 m)   Wt 182 lb (82.6 kg)   BMI 26.88 kg/m  General: 73 y.o. male well developed in NAD Eyes: sclera nonicteric, conjunctiva pink Mouth: dentition intact Neck: supple, no thyromegaly or lymphadenopathy Heart: RRR, no murmur Lungs: clear throughout Abdomen: soft, nontender, no masses or organomegaly, no HSM. + BS x 4 quads Rectal: moderate internal hemorrhoids palpated, brown stool with scant streak of red blood guaiac + Extremities: no edema    Assessment & Plan:    25. 73 year old male with a past medical history of malignant melanoma and colon polyps presents with hematochezia -CBC and CMP today -Discussed scheduling a flexible sigmoidoscopy for further evaluation, however, if his CBC shows anemia or he continues to have hematochezia a full colonoscopy would be required for possible diverticular bleed. -Patient advised to go to the local emergency room if he has worsening blood from the rectum, may require abd/pelvic CT angiogram if this occurs  -Anusol 25mg  suppository 1 PR Q HX x 5 and Desitin into the anal opening and to the  external anal area bid PRN for hemorrhoidal tx.   2. Fatigue -await CBC results  3. HTN. BP 175/105 in office today. HR 69. -repeat BP with PCP

## 2018-11-28 NOTE — Patient Instructions (Signed)
1. Complete the ordered lab test today  2. Schedule a flexible sigmoidoscopy, if your labs show you have anemia, then a full colonoscopy will be done instead of a sigmoidoscopy   3. If you have worsening rectal bleeding or worsening fatigue go to the local emergency room  4. Anusol suppository at bed time for 5 nights and [place] apply a small amount inside the anal opening twice daily as discussed in office today

## 2018-11-29 ENCOUNTER — Telehealth (INDEPENDENT_AMBULATORY_CARE_PROVIDER_SITE_OTHER): Payer: Self-pay | Admitting: Nurse Practitioner

## 2018-11-29 LAB — COMPREHENSIVE METABOLIC PANEL
AG Ratio: 1.8 (calc) (ref 1.0–2.5)
ALT: 19 U/L (ref 9–46)
AST: 18 U/L (ref 10–35)
Albumin: 4.2 g/dL (ref 3.6–5.1)
Alkaline phosphatase (APISO): 71 U/L (ref 35–144)
BUN: 14 mg/dL (ref 7–25)
CO2: 28 mmol/L (ref 20–32)
Calcium: 9.7 mg/dL (ref 8.6–10.3)
Chloride: 104 mmol/L (ref 98–110)
Creat: 0.86 mg/dL (ref 0.70–1.18)
Globulin: 2.4 g/dL (calc) (ref 1.9–3.7)
Glucose, Bld: 101 mg/dL (ref 65–139)
Potassium: 4.7 mmol/L (ref 3.5–5.3)
Sodium: 138 mmol/L (ref 135–146)
Total Bilirubin: 0.5 mg/dL (ref 0.2–1.2)
Total Protein: 6.6 g/dL (ref 6.1–8.1)

## 2018-11-29 LAB — CBC WITH DIFFERENTIAL/PLATELET
Absolute Monocytes: 832 cells/uL (ref 200–950)
Basophils Absolute: 50 cells/uL (ref 0–200)
Basophils Relative: 0.6 %
Eosinophils Absolute: 143 cells/uL (ref 15–500)
Eosinophils Relative: 1.7 %
HCT: 41.3 % (ref 38.5–50.0)
Hemoglobin: 14.1 g/dL (ref 13.2–17.1)
Lymphs Abs: 1739 cells/uL (ref 850–3900)
MCH: 33.7 pg — ABNORMAL HIGH (ref 27.0–33.0)
MCHC: 34.1 g/dL (ref 32.0–36.0)
MCV: 98.8 fL (ref 80.0–100.0)
MPV: 10.7 fL (ref 7.5–12.5)
Monocytes Relative: 9.9 %
Neutro Abs: 5636 cells/uL (ref 1500–7800)
Neutrophils Relative %: 67.1 %
Platelets: 239 10*3/uL (ref 140–400)
RBC: 4.18 10*6/uL — ABNORMAL LOW (ref 4.20–5.80)
RDW: 11.7 % (ref 11.0–15.0)
Total Lymphocyte: 20.7 %
WBC: 8.4 10*3/uL (ref 3.8–10.8)

## 2018-11-29 NOTE — Telephone Encounter (Signed)
I spoke to patient earlier today, see lab notes

## 2018-11-29 NOTE — Telephone Encounter (Signed)
Patient left message that he would like to talk to you - ph# [phone]

## 2018-11-30 ENCOUNTER — Telehealth (INDEPENDENT_AMBULATORY_CARE_PROVIDER_SITE_OTHER): Payer: Self-pay | Admitting: Nurse Practitioner

## 2018-11-30 NOTE — Telephone Encounter (Signed)
Ann please call patient to schedule a flexible sigmoidoscopy as ordered at the time of his ov 11/28/2018.  His H/H was normal. I called pt with results on 9/1. No further rectal bleeding after he used Anusol suppository. Flexi sig still advised to make sure nothing else above the hemorrhoids is cause of bleeding. Patient aware you would call him.

## 2018-11-30 NOTE — Telephone Encounter (Signed)
Left message for patient to call so I can schedule FS

## 2018-12-01 ENCOUNTER — Other Ambulatory Visit (INDEPENDENT_AMBULATORY_CARE_PROVIDER_SITE_OTHER): Payer: Self-pay | Admitting: *Deleted

## 2018-12-01 DIAGNOSIS — K219 Gastro-esophageal reflux disease without esophagitis: Secondary | ICD-10-CM

## 2018-12-01 MED ORDER — ESOMEPRAZOLE MAGNESIUM 40 MG PO CPDR: DELAYED_RELEASE_CAPSULE | ORAL | 3 refills | Status: DC

## 2018-12-06 DIAGNOSIS — Z0100 Encounter for examination of eyes and vision without abnormal findings: Secondary | ICD-10-CM | POA: Diagnosis not present

## 2018-12-07 NOTE — Telephone Encounter (Signed)
Left message for patient to call me to schedule Flex Sig

## 2018-12-08 NOTE — Telephone Encounter (Signed)
Patient returned call about scheduling flex sig, he wants to hold off for now. He went on to tell me he didn't have a good experience at his [facility] in December, he feels he was forgotten about and once the nurse came to get him no one apologized and at this point unless it's a dire emergency he will not step foot back in a [place] - he has appt with Jaclyn Shaggy 12/20/18 and plans on keeping that

## 2018-12-08 NOTE — Telephone Encounter (Signed)
Patient called back, wants to hold off on doing flex sig for now

## 2018-12-12 NOTE — Telephone Encounter (Signed)
I was not aware that he was unhappy his experience at the time of colonoscopy. He did not voice any concerns to me when I called to give him the biopsy results.

## 2018-12-15 DIAGNOSIS — R69 Illness, unspecified: Secondary | ICD-10-CM | POA: Diagnosis not present

## 2018-12-18 NOTE — Progress Notes (Signed)
Subjective:    Patient ID: Darryl Phillips, male    DOB: 12-14-1945, 73 y.o.   MRN: [id number]  HPI [name] is a 73 year old male with a past medical history significant for hypothyroidism, irritable bowel syndrome and malignant melanoma to his back with lymph node excision in 2009 and immunotherapy x 3 years.  He was last seen in the office 11/28/2018 with complaints of rectal bleeding which was triggered by episode of straining to pass a bowel movement.  He was prescribed Anusol suppositories which he took for 5 days and his rectal bleeding abated in 24 hours.  I also recommended a flexible sigmoidoscopy for further evaluation at that time to ensure there was nothing bleeding above the site of the hemorrhoids.  Since his rectal bleeding abated, he decided to cancel his flexible sigmoidoscopy.  As previously reviewed, his last colonoscopy was 03/28/2018 which identified diverticulosis to the transverse, distal transverse colon and sigmoid colon. A 85mm tubular adenomatous polyp was removed from the distal sigmoid colon. External hemorrhoids noted. He was advised to repeat a colonoscopy in 7 years.  He d enies having any abdominal pain or rectal pain.  He is passing a soft to loose brown bowel movement 1-2 times daily.  He is taking a probiotic and a fiber supplement daily as well.  He has known lactose intolerance and drinks almond milk.  He questions if any further testing for his loose stools should be done.  He denies taking any recent antibiotics.  I discussed celiac serology to assess for celiac disease, he declined for now.  I discussed treatment for IBS D with Xifaxan 1 3 times daily for 14 days, he declined this treatment for now.  No other complaints today.  Labs 11/28/2018: CBC with WBC 8.4.  Hemoglobin 14.1.  Hematocrit 41.3.  MCV 98.8.  Platelet 239.  Past Medical History:  Diagnosis Date  . Cancer ([place])    Melanoma back 2009  . Glaucoma   . Shingles   . Thyroid disease    Past  Surgical History:  Procedure Laterality Date  . cataract surgery     [facility] 2018. Also has a stent in his eye during surgery  . CHOLECYSTECTOMY     Dr. Arnoldo Morale  . COLONOSCOPY N/A 03/28/2018   Procedure: COLONOSCOPY;  Surgeon: Rogene Houston, MD;  [facility];  Service: Endoscopy;  Laterality: N/A;  830 - pt agreed to move per Tammy  . ESOPHAGOGASTRODUODENOSCOPY N/A 12/14/2013   Procedure: ESOPHAGOGASTRODUODENOSCOPY (EGD);  Surgeon: Rogene Houston, MD;  [facility];  Service: Endoscopy;  Laterality: N/A;  300  . melonoma     x 4 sugeries  . POLYPECTOMY  03/28/2018   Procedure: POLYPECTOMY;  Surgeon: Rogene Houston, MD;  [facility];  Service: Endoscopy;;  colon   Current Outpatient Medications on File Prior to Visit  Medication Sig Dispense Refill  . bismuth subsalicylate (PEPTO BISMOL) 262 MG/15ML suspension Take 30 mLs by mouth every 6 (six) hours as needed for indigestion.     . dorzolamide-timolol (COSOPT) 22.3-6.8 MG/ML ophthalmic solution Place 1 drop into both eyes 2 (two) times daily.  5  . esomeprazole (NEXIUM) 40 MG capsule TAKE 1 CAPSULE BY MOUTH ONCE DAILY IN THE MORNING BEFORE BREAKFAST 90 capsule 3  . levothyroxine (SYNTHROID, LEVOTHROID) 125 MCG tablet Take 125 mcg by mouth daily before breakfast.    . Multiple Vitamin (MULTIVITAMIN WITH MINERALS) TABS tablet Take 1 tablet  by mouth daily.    . Saw Palmetto 450 MG CAPS Take 450 mg by mouth daily.    . travoprost, benzalkonium, (TRAVATAN) 0.004 % ophthalmic solution Place 1 drop into both eyes at bedtime.     Current Facility-Administered Medications on File Prior to Visit  Medication Dose Route Frequency Provider Last Rate Last Dose  . methylPREDNISolone acetate (DEPO-MEDROL) injection 40 mg  40 mg Intra-articular Once Hilts, Legrand Como, MD       Allergies  Allergen Reactions  . Azithromycin Nausea And Vomiting  . Tetracyclines & Related     Skin on tongue peels off         Objective:   Physical Exam  BP (!) 144/85   Pulse 71   Temp 98.7 F (37.1 C)   Ht 5\' 9"  (1.753 m)   Wt 181 lb 11.2 oz (82.4 kg)   BMI 26.83 kg/m   General: 73 year old male in no acute distress Eyes: Sclera nonicteric, conjunctiva pink Heart: Regular rate and rhythm, no murmurs Lungs: Breath sounds clear throughout Abdomen: Soft, nontender, no masses or organomegaly Rectal: Deferred Extremities: No edema Neuro: Alert and oriented x4, no focal deficits     Assessment & Plan:   1.  Rectal bleeding, most likely due to internal hemorrhoids has abated after the use of Anusol suppositories for total of 5 days without recurrence -Patient declined a flexible sigmoidoscopy for further evaluation, however, if he has recurrence of rectal bleeding he will contact our office and flexible sigmoidoscopy would potentially be rescheduled with possible future internal hemorrhoid banding  2.  IBS-D - continue probiotic and fiber supplement -Discussed celiac serology to rule out celiac disease, patient declined for now -Discussed trial with Xifaxan, patient declined for now -We will call our office if his loose stools worsen Although up in the office in 4 months and as needed  3.  Lactose  Intolerance -Continue dairy free diet

## 2018-12-20 ENCOUNTER — Encounter (INDEPENDENT_AMBULATORY_CARE_PROVIDER_SITE_OTHER): Payer: Self-pay | Admitting: Nurse Practitioner

## 2018-12-20 ENCOUNTER — Ambulatory Visit (INDEPENDENT_AMBULATORY_CARE_PROVIDER_SITE_OTHER): Payer: Medicare HMO | Admitting: Nurse Practitioner

## 2018-12-20 ENCOUNTER — Other Ambulatory Visit: Payer: Self-pay

## 2018-12-20 VITALS — BP 144/85 | HR 71 | Temp 98.7°F | Ht 69.0 in | Wt 181.7 lb

## 2018-12-20 DIAGNOSIS — K921 Melena: Secondary | ICD-10-CM

## 2018-12-20 DIAGNOSIS — K580 Irritable bowel syndrome with diarrhea: Secondary | ICD-10-CM | POA: Diagnosis not present

## 2018-12-20 DIAGNOSIS — K649 Unspecified hemorrhoids: Secondary | ICD-10-CM | POA: Diagnosis not present

## 2018-12-20 NOTE — Patient Instructions (Signed)
1. Continue taking your fiber supplement and probiotic  2. Call our office if your rectal bleeding recurs or if your loose stools worsen  3. We discussed scheduling a flexible sigmoidoscopy and possible hemorrhoid banding if your rectal bleeding recurs  4. We discussed checking a blood test to check for celiac disease due to you having chronic loose stools, you will call our office if you decide to have this test done  5. We discussed a trial of Xifaxan (an antibiotic) which is a treatment for diarrhea irritable bowel syndrome, you will call our office if you decide to try this treatment  6. Follow up in office with Dr. Laural Golden in 4 months and as needed

## 2018-12-21 ENCOUNTER — Other Ambulatory Visit (INDEPENDENT_AMBULATORY_CARE_PROVIDER_SITE_OTHER): Payer: Self-pay | Admitting: Nurse Practitioner

## 2018-12-21 ENCOUNTER — Telehealth (INDEPENDENT_AMBULATORY_CARE_PROVIDER_SITE_OTHER): Payer: Self-pay | Admitting: Nurse Practitioner

## 2018-12-21 DIAGNOSIS — K580 Irritable bowel syndrome with diarrhea: Secondary | ICD-10-CM

## 2018-12-21 NOTE — Telephone Encounter (Signed)
I called pt, he decided he would like to get the celiac blood test done. I entered order and [facility] will [facility] tomorrow.

## 2018-12-21 NOTE — Telephone Encounter (Signed)
Patient called left voice mail message -  would like a call back - ph# [phone]

## 2018-12-22 DIAGNOSIS — K580 Irritable bowel syndrome with diarrhea: Secondary | ICD-10-CM | POA: Diagnosis not present

## 2018-12-23 LAB — CELIAC DISEASE PANEL
(tTG) Ab, IgA: 1 U/mL
(tTG) Ab, IgG: 1 U/mL
Gliadin IgA: 6 Units
Gliadin IgG: 1 Units
Immunoglobulin A: 406 mg/dL — ABNORMAL HIGH (ref 70–320)

## 2018-12-27 DIAGNOSIS — D485 Neoplasm of uncertain behavior of skin: Secondary | ICD-10-CM | POA: Diagnosis not present

## 2018-12-27 DIAGNOSIS — L570 Actinic keratosis: Secondary | ICD-10-CM | POA: Diagnosis not present

## 2018-12-27 DIAGNOSIS — L820 Inflamed seborrheic keratosis: Secondary | ICD-10-CM | POA: Diagnosis not present

## 2018-12-28 ENCOUNTER — Telehealth (INDEPENDENT_AMBULATORY_CARE_PROVIDER_SITE_OTHER): Payer: Self-pay | Admitting: Nurse Practitioner

## 2018-12-28 NOTE — Telephone Encounter (Signed)
Patient called regarding celiac test results - ph# [phone]

## 2018-12-29 NOTE — Telephone Encounter (Signed)
I called the patient left a message on his voicemail that his blood test did not show any evidence of celiac disease

## 2019-01-18 ENCOUNTER — Other Ambulatory Visit: Payer: Self-pay

## 2019-01-18 ENCOUNTER — Ambulatory Visit (INDEPENDENT_AMBULATORY_CARE_PROVIDER_SITE_OTHER): Payer: Medicare HMO | Admitting: Family Medicine

## 2019-01-18 ENCOUNTER — Ambulatory Visit: Payer: Self-pay

## 2019-01-18 ENCOUNTER — Encounter: Payer: Self-pay | Admitting: Family Medicine

## 2019-01-18 DIAGNOSIS — G8929 Other chronic pain: Secondary | ICD-10-CM | POA: Diagnosis not present

## 2019-01-18 DIAGNOSIS — M25511 Pain in right shoulder: Secondary | ICD-10-CM

## 2019-01-18 NOTE — Progress Notes (Signed)
Office Visit Note   Patient: Darryl Phillips           Date of Birth: July 28, 1945           MRN: [id number] Visit Date: 01/18/2019 Requested by: Sharilyn Sites, [address] [place],  [place] [address] PCP: Sharilyn Sites, MD  Subjective: Chief Complaint  Patient presents with  . Right Shoulder - Pain    Last cortisone injection (10/14/18) lasted about 2 weeks (in [facility] joint).    HPI: He is here with recurrent right shoulder pain.  Last [facility] joint injection only helped for about 2 to 3 weeks.  Very good relief during that time, but it did not last very long.  Pain remains on the top of his shoulder.               ROS: No fevers or chills.  All other systems were reviewed and are negative.  Objective: Vital Signs: There were no vitals taken for this visit.  Physical Exam:  General:  Alert and oriented, in no acute distress. Pulm:  Breathing unlabored. Psy:  Normal mood, congruent affect. Skin: No rash or erythema. Right shoulder: Full range of motion, point tender at the [facility] joint.  Pain with AC crossover test.  Imaging: None other than for injection.  Assessment & Plan: 1.  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  If he does not get extended relief, we will try prolotherapy with dextrose.     Procedures: Ultrasound-guided right shoulder injection: After sterile prep with Betadine, injected 3 cc 1% lidocaine without epinephrine and 40 mg methylprednisolone into the [facility] joint using ultrasound to guide needle placement.  Good immediate relief.    PMFS History: Patient Active Problem List   Diagnosis Date Noted  . Hemorrhoids 12/20/2018  . Fatigue 11/28/2018  . Hematochezia 11/28/2018  . Special screening for malignant neoplasms, colon 11/11/2017  . History of malignant melanoma 01/24/2014  . Irritable bowel syndrome 01/24/2014  . GERD (gastroesophageal reflux disease) 11/21/2013  . Unspecified  hypothyroidism 11/21/2013  . Glaucoma 11/21/2013   Past Medical History:  Diagnosis Date  . Cancer ([place])    Melanoma back 2009  . Glaucoma   . Shingles   . Thyroid disease     History reviewed. No pertinent family history.  Past Surgical History:  Procedure Laterality Date  . cataract surgery     [facility] 2018. Also has a stent in his eye during surgery  . CHOLECYSTECTOMY     Dr. Arnoldo Morale  . COLONOSCOPY N/A 03/28/2018   Procedure: COLONOSCOPY;  Surgeon: Rogene Houston, MD;  [facility];  Service: Endoscopy;  Laterality: N/A;  830 - pt agreed to move per Tammy  . ESOPHAGOGASTRODUODENOSCOPY N/A 12/14/2013   Procedure: ESOPHAGOGASTRODUODENOSCOPY (EGD);  Surgeon: Rogene Houston, MD;  [facility];  Service: Endoscopy;  Laterality: N/A;  300  . melonoma     x 4 sugeries  . POLYPECTOMY  03/28/2018   Procedure: POLYPECTOMY;  Surgeon: Rogene Houston, MD;  [facility];  Service: Endoscopy;;  colon   Social History   Occupational History  . Not on file  Tobacco Use  . Smoking status: Former Smoker    Quit date: 01/23/1969    Years since quitting: 50.0  . Smokeless tobacco: Never Used  Substance and Sexual Activity  . Alcohol use: No    Alcohol/week: 0.0 standard drinks  .  Drug use: No  . Sexual activity: Not on file

## 2019-02-02 ENCOUNTER — Ambulatory Visit (INDEPENDENT_AMBULATORY_CARE_PROVIDER_SITE_OTHER): Payer: Medicare HMO | Admitting: Nurse Practitioner

## 2019-04-05 DIAGNOSIS — Z0000 Encounter for general adult medical examination without abnormal findings: Secondary | ICD-10-CM | POA: Diagnosis not present

## 2019-04-05 DIAGNOSIS — Z1389 Encounter for screening for other disorder: Secondary | ICD-10-CM | POA: Diagnosis not present

## 2019-04-05 DIAGNOSIS — Z6826 Body mass index (BMI) 26.0-26.9, adult: Secondary | ICD-10-CM | POA: Diagnosis not present

## 2019-04-05 DIAGNOSIS — E663 Overweight: Secondary | ICD-10-CM | POA: Diagnosis not present

## 2019-04-05 DIAGNOSIS — E063 Autoimmune thyroiditis: Secondary | ICD-10-CM | POA: Diagnosis not present

## 2019-04-05 DIAGNOSIS — Z0001 Encounter for general adult medical examination with abnormal findings: Secondary | ICD-10-CM | POA: Diagnosis not present

## 2019-04-05 DIAGNOSIS — M545 Low back pain: Secondary | ICD-10-CM | POA: Diagnosis not present

## 2019-04-24 ENCOUNTER — Encounter (INDEPENDENT_AMBULATORY_CARE_PROVIDER_SITE_OTHER): Payer: Self-pay | Admitting: Gastroenterology

## 2019-04-24 ENCOUNTER — Ambulatory Visit (INDEPENDENT_AMBULATORY_CARE_PROVIDER_SITE_OTHER): Payer: Medicare HMO | Admitting: Gastroenterology

## 2019-04-24 ENCOUNTER — Other Ambulatory Visit: Payer: Self-pay

## 2019-04-24 VITALS — BP 148/77 | HR 81 | Temp 98.6°F | Ht 69.0 in | Wt 182.6 lb

## 2019-04-24 DIAGNOSIS — K625 Hemorrhage of anus and rectum: Secondary | ICD-10-CM

## 2019-04-24 DIAGNOSIS — K580 Irritable bowel syndrome with diarrhea: Secondary | ICD-10-CM | POA: Diagnosis not present

## 2019-04-24 DIAGNOSIS — K219 Gastro-esophageal reflux disease without esophagitis: Secondary | ICD-10-CM | POA: Diagnosis not present

## 2019-04-24 NOTE — Progress Notes (Signed)
Patient profile: Darryl Phillips is a 74 y.o. male seen f.up. Last seen in clinic on 11/2018   History of Present Illness: Darryl Phillips is seen today for f/up rectal bleeding and irritable bowel with diarrhea. Celiac panel at that visit was normal.  CBC was also normal and given he had a colonoscopy in the past year further observation not completed.  He is seen today for follow-up and reports he is nonremarkable well.  He has not seen any further rectal bleeding-he feels the initial episode was due to constipation and straining.  He is now getting more flow in his diet is not having straining.  His bowels are actually doing well, he has a history of irritable bowel with diarrhea but reports more recently is having a bowel movement usually once a day, about once a week he will have diarrhea if he eats fried foods, this usually resolves spontaneously and he does not use Imodium etc.  He has no abdominal pain.  Takes probiotic daily.  He denies any significant upper GI symptoms, he is on Nexium 40 mg and does well as long as he takes it.  He denies any GERD, nausea, vomiting.  Occasionally uses Pepto 1-2 times a month if does get rare symptoms.  Non-smoker, no alcohol, no NSAIDs.   Wt Readings from Last 3 Encounters:  04/24/19 182 lb 9.6 oz (82.8 kg)  12/20/18 181 lb 11.2 oz (82.4 kg)  11/28/18 182 lb (82.6 kg)     Last Colonoscopy: 2019-diverticulosis in transverse, distal transverse and sigmoid.  4 mm polyp distal sigmoid.  7-year repeat recommended depending on age and comorbidities at that time.   Last Endoscopy: 2015- Impression: Nonerosive antral gastritis otherwise normal EGD. Biopsies taken from antral and post bulbar duodenal mucosa. Comment; Patient's symptoms are suggestive of small bowel bacterial overgrowth. Pathology with moderate gastritis, negative H. pylori.  Past Medical History:  Past Medical History:  Diagnosis Date  . Cancer ([place])    Melanoma back 2009  .  Glaucoma   . Shingles   . Thyroid disease     Problem List: Patient Active Problem List   Diagnosis Date Noted  . Hemorrhoids 12/20/2018  . Fatigue 11/28/2018  . Hematochezia 11/28/2018  . Special screening for malignant neoplasms, colon 11/11/2017  . History of malignant melanoma 01/24/2014  . Irritable bowel syndrome 01/24/2014  . GERD (gastroesophageal reflux disease) 11/21/2013  . Unspecified hypothyroidism 11/21/2013  . Glaucoma 11/21/2013    Past Surgical History: Past Surgical History:  Procedure Laterality Date  . cataract surgery     [facility] 2018. Also has a stent in his eye during surgery  . CHOLECYSTECTOMY     Dr. Arnoldo Morale  . COLONOSCOPY N/A 03/28/2018   Procedure: COLONOSCOPY;  Surgeon: Rogene Houston, MD;  [facility];  Service: Endoscopy;  Laterality: N/A;  830 - pt agreed to move per Tammy  . ESOPHAGOGASTRODUODENOSCOPY N/A 12/14/2013   Procedure: ESOPHAGOGASTRODUODENOSCOPY (EGD);  Surgeon: Rogene Houston, MD;  [facility];  Service: Endoscopy;  Laterality: N/A;  300  . melonoma     x 4 sugeries  . POLYPECTOMY  03/28/2018   Procedure: POLYPECTOMY;  Surgeon: Rogene Houston, MD;  [facility];  Service: Endoscopy;;  colon    Allergies: Allergies  Allergen Reactions  . Azithromycin Nausea And Vomiting  . Tetracyclines & Related     Skin on tongue peels off       Home Medications:  Current Outpatient Medications:  .  bismuth subsalicylate (PEPTO BISMOL) 262 MG/15ML suspension, Take 30 mLs by mouth every 6 (six) hours as needed for indigestion. , Disp: , Rfl:  .  dorzolamide-timolol (COSOPT) 22.3-6.8 MG/ML ophthalmic solution, Place 1 drop into both eyes 2 (two) times daily., Disp: , Rfl: 5 .  esomeprazole (NEXIUM) 40 MG capsule, TAKE 1 CAPSULE BY MOUTH ONCE DAILY IN THE MORNING BEFORE BREAKFAST, Disp: 90 capsule, Rfl: 3 .  levothyroxine (SYNTHROID, LEVOTHROID) 125 MCG tablet, Take 125 mcg by mouth daily before  breakfast., Disp: , Rfl:  .  Multiple Vitamin (MULTIVITAMIN WITH MINERALS) TABS tablet, Take 1 tablet by mouth daily., Disp: , Rfl:  .  Saw Palmetto 450 MG CAPS, Take 450 mg by mouth daily., Disp: , Rfl:  .  travoprost, benzalkonium, (TRAVATAN) 0.004 % ophthalmic solution, Place 1 drop into both eyes at bedtime., Disp: , Rfl:   Current Facility-Administered Medications:  .  methylPREDNISolone acetate (DEPO-MEDROL) injection 40 mg, 40 mg, Intra-articular, Once, Hilts, Michael, MD   Family History: family history is not on file.    Social History:   reports that he quit smoking about 50 years ago. He has never used smokeless tobacco. He reports that he does not drink alcohol or use drugs.   Review of Systems: Constitutional: Denies weight loss/weight gain  Eyes: No changes in vision. ENT: No oral lesions, sore throat.  GI: see HPI.  Heme/Lymph: No easy bruising.  CV: No chest pain.  GU: No hematuria.  Integumentary: No rashes.  Neuro: No headaches.  Psych: No depression/anxiety.  Endocrine: No heat/cold intolerance.  Allergic/Immunologic: No urticaria.  Resp: No cough, SOB.  Musculoskeletal: No joint swelling.    Physical Examination: BP (!) 148/77 (BP Location: Right Arm, Patient Position: Sitting, Cuff Size: Large)   Pulse 81   Temp 98.6 F (37 C) (Oral)   Ht 5\' 9"  (1.753 m)   Wt 182 lb 9.6 oz (82.8 kg)   BMI 26.97 kg/m  Gen: NAD, alert and oriented x 4 HEENT: PEERLA, EOMI, Neck: supple, no JVD Chest: CTA bilaterally, no wheezes, crackles, or other adventitious sounds CV: RRR, no m/g/c/r Abd: soft, NT, ND, +BS in all four quadrants; no HSM, guarding, ridigity, or rebound tenderness Ext: no edema, well perfused with 2+ pulses, Skin: no rash or lesions noted on observed skin Lymph: no noted LAD  Data Reviewed:  10/2018-CMP and CBC normal   Assessment/Plan: Mr. Osmun is a 74 y.o. male seen for f/up  1.  Rectal bleeding-resolved, was one episode approximately 4  months ago and CBC was normal. Likely related to constipation. He is up-to-date on colonoscopy last 2019.  He is to notify me if bleeding returns. He will make sure he is drinking adequate fluid to avoid constipation.  2.  GERD-symptoms well controlled on current dose of PPI.  No upper GI alarm symptoms  3.  Irritable bowel with diarrhea-very occasional symptoms.  He is not needing any antispasmodics, etc.  Continue avoiding diet triggers, reviewed can use Imodium as needed occasionally.   Jerrick was seen today for follow-up.  Diagnoses and all orders for this visit:  Rectal bleeding  Chronic GERD  Irritable bowel syndrome with diarrhea     Recommendations:  Follow-up 1 year if doing well, alarm symptoms reviewed to contact with [place] sooner   I personally performed the service, non-incident to. (WP)  Laurine Blazer, [facility]

## 2019-04-24 NOTE — Patient Instructions (Addendum)
Continue Nexium daily, please contact us if you have any symptoms such as worsening abdominal pain, indigestion, rectal bleeding, changes in appetite, etc. Otherwise we will plan to follow-up in 1 year.

## 2019-06-01 DIAGNOSIS — L280 Lichen simplex chronicus: Secondary | ICD-10-CM | POA: Diagnosis not present

## 2019-06-28 DIAGNOSIS — I10 Essential (primary) hypertension: Secondary | ICD-10-CM | POA: Diagnosis not present

## 2019-06-28 DIAGNOSIS — H401111 Primary open-angle glaucoma, right eye, mild stage: Secondary | ICD-10-CM | POA: Diagnosis not present

## 2019-06-28 DIAGNOSIS — E039 Hypothyroidism, unspecified: Secondary | ICD-10-CM | POA: Diagnosis not present

## 2019-06-28 DIAGNOSIS — H401122 Primary open-angle glaucoma, left eye, moderate stage: Secondary | ICD-10-CM | POA: Diagnosis not present

## 2019-07-10 DIAGNOSIS — L570 Actinic keratosis: Secondary | ICD-10-CM | POA: Diagnosis not present

## 2019-07-10 DIAGNOSIS — Z85820 Personal history of malignant melanoma of skin: Secondary | ICD-10-CM | POA: Diagnosis not present

## 2019-07-10 DIAGNOSIS — Z85828 Personal history of other malignant neoplasm of skin: Secondary | ICD-10-CM | POA: Diagnosis not present

## 2019-07-17 ENCOUNTER — Encounter: Payer: Self-pay | Admitting: Family Medicine

## 2019-07-17 ENCOUNTER — Ambulatory Visit: Payer: Medicare HMO | Admitting: Family Medicine

## 2019-07-17 ENCOUNTER — Ambulatory Visit: Payer: Self-pay

## 2019-07-17 ENCOUNTER — Other Ambulatory Visit: Payer: Self-pay

## 2019-07-17 DIAGNOSIS — G8929 Other chronic pain: Secondary | ICD-10-CM

## 2019-07-17 DIAGNOSIS — M25511 Pain in right shoulder: Secondary | ICD-10-CM | POA: Diagnosis not present

## 2019-07-17 NOTE — Progress Notes (Signed)
Wants another injection

## 2019-07-17 NOTE — Progress Notes (Signed)
   Office Visit Note   Patient: Darryl Phillips           Date of Birth: [birth year]           MRN: [id number] Visit Date: 07/17/2019 Requested by: Sharilyn Sites, [address] [place],  [place] [address] PCP: Sharilyn Sites, MD  Subjective: Chief Complaint  Patient presents with  . Right Shoulder - Follow-up    HPI: He is here with recurrent right shoulder pain.  Last injection helped quite a bit.  Recently he started having some pain.  It is not yet severe, but he would like another injection.              ROS:   All other systems were reviewed and are negative.  Objective: Vital Signs: There were no vitals taken for this visit.  Physical Exam:  General:  Alert and oriented, in no acute distress. Pulm:  Breathing unlabored. Psy:  Normal mood, congruent affect. Skin: No bruising or erythema. Right shoulder: He is tender to palpation over the [facility] joint again.  He has full range of motion, positive AC crossover test, 5/5 rotator cuff strength.  Imaging: None today  Assessment & Plan: 1.  Right shoulder AC joint arthropathy -Inject again today under ultrasound guidance.  Follow-up as needed.     Procedures: Right shoulder ultrasound-guided AC joint injection: After sterile prep with Betadine, injected 5 cc 1% lidocaine without epinephrine and 40 mg methylprednisolone into the [facility] joint, injectate was seen filling the joint capsule.  He had good immediate relief.    PMFS History: Patient Active Problem List   Diagnosis Date Noted  . Hemorrhoids 12/20/2018  . Fatigue 11/28/2018  . Hematochezia 11/28/2018  . Special screening for malignant neoplasms, colon 11/11/2017  . History of malignant melanoma 01/24/2014  . Irritable bowel syndrome 01/24/2014  . GERD (gastroesophageal reflux disease) 11/21/2013  . Unspecified hypothyroidism 11/21/2013  . Glaucoma 11/21/2013   Past Medical History:  Diagnosis Date  . Cancer ([place])    Melanoma back 2009  . Glaucoma   .  Shingles   . Thyroid disease     History reviewed. No pertinent family history.  Past Surgical History:  Procedure Laterality Date  . cataract surgery     [facility] 2018. Also has a stent in his eye during surgery  . CHOLECYSTECTOMY     Dr. Arnoldo Morale  . COLONOSCOPY N/A 03/28/2018   Procedure: COLONOSCOPY;  Surgeon: Rogene Houston, MD;  [facility];  Service: Endoscopy;  Laterality: N/A;  830 - pt agreed to move per Tammy  . ESOPHAGOGASTRODUODENOSCOPY N/A 12/14/2013   Procedure: ESOPHAGOGASTRODUODENOSCOPY (EGD);  Surgeon: Rogene Houston, MD;  [facility];  Service: Endoscopy;  Laterality: N/A;  300  . melonoma     x 4 sugeries  . POLYPECTOMY  03/28/2018   Procedure: POLYPECTOMY;  Surgeon: Rogene Houston, MD;  [facility];  Service: Endoscopy;;  colon   Social History   Occupational History  . Not on file  Tobacco Use  . Smoking status: Former Smoker    Quit date: 01/23/1969    Years since quitting: 50.5  . Smokeless tobacco: Never Used  Substance and Sexual Activity  . Alcohol use: No    Alcohol/week: 0.0 standard drinks  . Drug use: No  . Sexual activity: Not on file

## 2019-07-26 DIAGNOSIS — M5410 Radiculopathy, site unspecified: Secondary | ICD-10-CM | POA: Diagnosis not present

## 2019-07-26 DIAGNOSIS — E663 Overweight: Secondary | ICD-10-CM | POA: Diagnosis not present

## 2019-07-26 DIAGNOSIS — M545 Low back pain: Secondary | ICD-10-CM | POA: Diagnosis not present

## 2019-07-26 DIAGNOSIS — Z6826 Body mass index (BMI) 26.0-26.9, adult: Secondary | ICD-10-CM | POA: Diagnosis not present

## 2019-08-28 DIAGNOSIS — E039 Hypothyroidism, unspecified: Secondary | ICD-10-CM | POA: Diagnosis not present

## 2019-08-28 DIAGNOSIS — H401111 Primary open-angle glaucoma, right eye, mild stage: Secondary | ICD-10-CM | POA: Diagnosis not present

## 2019-08-28 DIAGNOSIS — H401122 Primary open-angle glaucoma, left eye, moderate stage: Secondary | ICD-10-CM | POA: Diagnosis not present

## 2019-08-28 DIAGNOSIS — I10 Essential (primary) hypertension: Secondary | ICD-10-CM | POA: Diagnosis not present

## 2019-09-05 ENCOUNTER — Telehealth: Payer: Self-pay | Admitting: Family Medicine

## 2019-09-05 NOTE — Telephone Encounter (Signed)
Patient called.   Requesting a call back to answer some questions he has about the injection he received  Call back: [phone]

## 2019-09-05 NOTE — Telephone Encounter (Signed)
Lmom for pt to call me back. 

## 2019-09-07 NOTE — Telephone Encounter (Signed)
Lmom #2 for pt to call me back to discuss his message

## 2019-09-08 NOTE — Telephone Encounter (Signed)
I called patient and he wanted to know when his last injection in his shoulder was and when he would be eligible for another injection.  I advised that the last injection was 07/17/19, and the next one can be done 3 months after that. He states that he understands this, and that he is also wanting to have his back checked out and possible get an ESI scheduled. I advised that he would need to be evaluated for the back before we can schedule for the ESI--states that he understands

## 2019-09-08 NOTE — Telephone Encounter (Signed)
Patient returned your call.  I did advised him to stay by his phone.

## 2019-09-27 DIAGNOSIS — M5410 Radiculopathy, site unspecified: Secondary | ICD-10-CM | POA: Diagnosis not present

## 2019-09-27 DIAGNOSIS — E663 Overweight: Secondary | ICD-10-CM | POA: Diagnosis not present

## 2019-09-27 DIAGNOSIS — Z6826 Body mass index (BMI) 26.0-26.9, adult: Secondary | ICD-10-CM | POA: Diagnosis not present

## 2019-10-17 DIAGNOSIS — Z6826 Body mass index (BMI) 26.0-26.9, adult: Secondary | ICD-10-CM | POA: Diagnosis not present

## 2019-10-17 DIAGNOSIS — Z125 Encounter for screening for malignant neoplasm of prostate: Secondary | ICD-10-CM | POA: Diagnosis not present

## 2019-10-17 DIAGNOSIS — E890 Postprocedural hypothyroidism: Secondary | ICD-10-CM | POA: Diagnosis not present

## 2019-10-17 DIAGNOSIS — E663 Overweight: Secondary | ICD-10-CM | POA: Diagnosis not present

## 2019-10-27 DIAGNOSIS — I10 Essential (primary) hypertension: Secondary | ICD-10-CM | POA: Diagnosis not present

## 2019-10-27 DIAGNOSIS — E039 Hypothyroidism, unspecified: Secondary | ICD-10-CM | POA: Diagnosis not present

## 2019-10-27 DIAGNOSIS — H401122 Primary open-angle glaucoma, left eye, moderate stage: Secondary | ICD-10-CM | POA: Diagnosis not present

## 2019-10-27 DIAGNOSIS — H401111 Primary open-angle glaucoma, right eye, mild stage: Secondary | ICD-10-CM | POA: Diagnosis not present

## 2019-11-17 ENCOUNTER — Ambulatory Visit (INDEPENDENT_AMBULATORY_CARE_PROVIDER_SITE_OTHER): Payer: Medicare HMO

## 2019-11-17 ENCOUNTER — Other Ambulatory Visit: Payer: Self-pay

## 2019-11-17 ENCOUNTER — Ambulatory Visit: Payer: Self-pay

## 2019-11-17 ENCOUNTER — Ambulatory Visit (INDEPENDENT_AMBULATORY_CARE_PROVIDER_SITE_OTHER): Payer: Medicare HMO | Admitting: Family Medicine

## 2019-11-17 ENCOUNTER — Encounter: Payer: Self-pay | Admitting: Family Medicine

## 2019-11-17 DIAGNOSIS — G8929 Other chronic pain: Secondary | ICD-10-CM

## 2019-11-17 DIAGNOSIS — M25531 Pain in right wrist: Secondary | ICD-10-CM

## 2019-11-17 DIAGNOSIS — M25511 Pain in right shoulder: Secondary | ICD-10-CM

## 2019-11-17 NOTE — Progress Notes (Signed)
Office Visit Note   Patient: Darryl Phillips           Date of Birth: 1946-02-04           MRN: [id number] Visit Date: 11/17/2019 Requested by: Sharilyn Sites, [address] [place],  [place] [address] PCP: Sharilyn Sites, MD  Subjective: Chief Complaint  Patient presents with  . Right Shoulder - Pain  . Right Wrist - Pain    HPI: He is here with right shoulder and wrist pain.  Longstanding problems with his shoulder, AC joint injections have given him good relief.  His last 1 was in April.  He would like another one today.  His right wrist has also bother him intermittently for many years.  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  He gets occasional stiffness in his wrist, but has learned to live with it.  It bothers him when playing golf sometimes, or when using a screwdriver.  He uses blue emu with good results.               ROS:   All other systems were reviewed and are negative.  Objective: Vital Signs: There were no vitals taken for this visit.  Physical Exam:  General:  Alert and oriented, in no acute distress. Pulm:  Breathing unlabored. Psy:  Normal mood, congruent affect.  Right shoulder: He is point tender over the [facility] joint.  Full range of motion of the shoulder. Right wrist: He has some stiffness with dorsiflexion and volar flexion and he is tender to palpation dorsally over the scapholunate area.  Imaging: US Guided Needle Placement  Result Date: 11/17/2019 Right shoulder AC joint injection: After sterile prep with Betadine, injected 5 cc 1% lidocaine without epinephrine and 40 mg methylprednisolone using ultrasound to guide needle placement.  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   Assessment & Plan: 1.  Right shoulder AC joint arthropathy -Per patient  request, ultrasound-guided injection today.  Follow-up as needed.  2.  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 -Surprisingly minimally symptomatic.  If it becomes worse, we could inject.     Procedures: No procedures performed  No notes on file     PMFS History: Patient Active Problem List   Diagnosis Date Noted  . Hemorrhoids 12/20/2018  . Fatigue 11/28/2018  . Hematochezia 11/28/2018  . Special screening for malignant neoplasms, colon 11/11/2017  . History of malignant melanoma 01/24/2014  . Irritable bowel syndrome 01/24/2014  . GERD (gastroesophageal reflux disease) 11/21/2013  . Unspecified hypothyroidism 11/21/2013  . Glaucoma 11/21/2013   Past Medical History:  Diagnosis Date  . Cancer ([place])    Melanoma back 2009  . Glaucoma   . Shingles   . Thyroid disease     History reviewed. No pertinent family history.  Past Surgical History:  Procedure Laterality Date  . cataract surgery     [facility] 2018. Also has a stent in his eye during surgery  . CHOLECYSTECTOMY     Dr. Arnoldo Morale  . COLONOSCOPY N/A 03/28/2018   Procedure: COLONOSCOPY;  Surgeon: Rogene Houston, MD;  [facility];  Service: Endoscopy;  Laterality: N/A;  830 - pt agreed to move per Tammy  . ESOPHAGOGASTRODUODENOSCOPY N/A 12/14/2013   Procedure: ESOPHAGOGASTRODUODENOSCOPY (EGD);  Surgeon: Rogene Houston, MD;  [facility];  Service: Endoscopy;  Laterality: N/A;  300  . melonoma     x 4 sugeries  . POLYPECTOMY  03/28/2018   Procedure: POLYPECTOMY;  Surgeon: Rogene Houston, MD;  [facility];  Service: Endoscopy;;  colon   Social History   Occupational History  . Not on file  Tobacco Use  . Smoking status: Former Smoker    Quit date: 01/23/1969    Years since quitting: 50.8  . Smokeless tobacco: Never Used  Substance and Sexual Activity  . Alcohol use: No    Alcohol/week: 0.0 standard drinks  . Drug use: No  . Sexual activity: Not on  file

## 2019-12-15 DIAGNOSIS — H25011 Cortical age-related cataract, right eye: Secondary | ICD-10-CM | POA: Diagnosis not present

## 2019-12-15 DIAGNOSIS — H401122 Primary open-angle glaucoma, left eye, moderate stage: Secondary | ICD-10-CM | POA: Diagnosis not present

## 2019-12-15 DIAGNOSIS — H401123 Primary open-angle glaucoma, left eye, severe stage: Secondary | ICD-10-CM | POA: Diagnosis not present

## 2019-12-15 DIAGNOSIS — H401121 Primary open-angle glaucoma, left eye, mild stage: Secondary | ICD-10-CM | POA: Diagnosis not present

## 2019-12-27 DIAGNOSIS — H401121 Primary open-angle glaucoma, left eye, mild stage: Secondary | ICD-10-CM | POA: Diagnosis not present

## 2019-12-28 DIAGNOSIS — I10 Essential (primary) hypertension: Secondary | ICD-10-CM | POA: Diagnosis not present

## 2019-12-28 DIAGNOSIS — E039 Hypothyroidism, unspecified: Secondary | ICD-10-CM | POA: Diagnosis not present

## 2019-12-28 DIAGNOSIS — H401122 Primary open-angle glaucoma, left eye, moderate stage: Secondary | ICD-10-CM | POA: Diagnosis not present

## 2019-12-28 DIAGNOSIS — H401111 Primary open-angle glaucoma, right eye, mild stage: Secondary | ICD-10-CM | POA: Diagnosis not present

## 2020-01-03 ENCOUNTER — Other Ambulatory Visit: Payer: Self-pay

## 2020-01-03 ENCOUNTER — Ambulatory Visit: Payer: Self-pay

## 2020-01-03 ENCOUNTER — Ambulatory Visit (INDEPENDENT_AMBULATORY_CARE_PROVIDER_SITE_OTHER): Payer: Medicare HMO | Admitting: Family Medicine

## 2020-01-03 ENCOUNTER — Encounter: Payer: Self-pay | Admitting: Family Medicine

## 2020-01-03 DIAGNOSIS — M25531 Pain in right wrist: Secondary | ICD-10-CM | POA: Diagnosis not present

## 2020-01-03 NOTE — Progress Notes (Signed)
I saw and examined the patient with Dr. Elouise Munroe and agree with assessment and plan as outlined.    Right wrist DJD with Kienbock's, starting to hurt all the time.  Injected with steroid today.  Return as needed.

## 2020-01-03 NOTE — Progress Notes (Signed)
Office Visit Note   Patient: Darryl Phillips           Date of Birth: 10/29/1945           MRN: [id number] Visit Date: 01/03/2020 Requested by: Sharilyn Sites, [address] [place],  [place] [address] PCP: Sharilyn Sites, MD  Subjective: Chief Complaint  Patient presents with  . Right Wrist - Pain    Requests cortisone injection.    HPI: 74yo M with Kienboch disease presenting to clinic for R wrist injection. 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 He has no acute concerns today.               ROS:   All other systems were reviewed and are negative.  Objective: Vital Signs: There were no vitals taken for this visit.  Physical Exam:  General:  Alert and oriented, in no acute distress. Pulm:  Breathing unlabored. Psy:  Normal mood, congruent affect. Skin:  Right wrist overlying skin intact, with no erythema, bruising, or rashes.   Right wrist with prominent lunate, which is tender to palpation.   Imaging: US Guided Needle Placement - No Linked Charges  Result Date: 01/03/2020 Right scapholunate joint injected under ultrasound guidance, out of plane technique, using 2 cc 1% lidocaine without epi and 6 mg betamethasone.  Good immediate relief.   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 Procedure performed as described below, which patient tolerated very well. Strict return precautions discussed.      Procedures: Right wrist Cortisone Injection:  Risks and benefits of procedure discussed, Patient opted to proceed. Verbal Consent obtained.  Timeout performed.  Skin prepped in a sterile fashion with betadine before further cleansing with alcohol. Ethyl Chloride was used for topical analgesia.   Right Scapholunate area was injected with 2cc 1% Lidocaine without epinephrine under US guidance using a 27G, 1.5in needle. Syringe was removed from the needle, and 6mg  betamethasone was then injected into the joint.   Patient tolerated the injection well with no immediate complications. Aftercare instructions were discussed, and patient was given strict return precautions.      PMFS History: Patient Active Problem List   Diagnosis Date Noted  . Hemorrhoids 12/20/2018  . Fatigue 11/28/2018  . Hematochezia 11/28/2018  . Special screening for malignant neoplasms, colon 11/11/2017  . History of malignant melanoma 01/24/2014  . Irritable bowel syndrome 01/24/2014  . GERD (gastroesophageal reflux disease) 11/21/2013  . Unspecified hypothyroidism 11/21/2013  . Glaucoma 11/21/2013   Past Medical History:  Diagnosis Date  . Cancer ([place])    Melanoma back 2009  . Glaucoma   . Shingles   . Thyroid disease     History reviewed. No pertinent family history.  Past Surgical History:  Procedure Laterality Date  . cataract surgery     [facility] 2018. Also has a stent in his eye during surgery  . CHOLECYSTECTOMY     Dr. Arnoldo Morale  . COLONOSCOPY N/A 03/28/2018   Procedure: COLONOSCOPY;  Surgeon: Rogene Houston, MD;  [facility];  Service: Endoscopy;  Laterality: N/A;  830 - pt agreed to move per Tammy  . ESOPHAGOGASTRODUODENOSCOPY N/A 12/14/2013   Procedure: ESOPHAGOGASTRODUODENOSCOPY (EGD);  Surgeon:  Rogene Houston, MD;  [facility];  Service: Endoscopy;  Laterality: N/A;  300  . melonoma     x 4 sugeries  . POLYPECTOMY  03/28/2018   Procedure: POLYPECTOMY;  Surgeon: Rogene Houston, MD;  [facility];  Service: Endoscopy;;  colon   Social History   Occupational History  . Not on file  Tobacco Use  . Smoking status: Former Smoker    Quit date: 01/23/1969    Years since quitting: 50.9  . Smokeless tobacco: Never Used  Substance and  Sexual Activity  . Alcohol use: No    Alcohol/week: 0.0 standard drinks  . Drug use: No  . Sexual activity: Not on file

## 2020-01-08 DIAGNOSIS — L570 Actinic keratosis: Secondary | ICD-10-CM | POA: Diagnosis not present

## 2020-01-10 DIAGNOSIS — Z01818 Encounter for other preprocedural examination: Secondary | ICD-10-CM | POA: Diagnosis not present

## 2020-01-10 DIAGNOSIS — H401122 Primary open-angle glaucoma, left eye, moderate stage: Secondary | ICD-10-CM | POA: Diagnosis not present

## 2020-01-15 DIAGNOSIS — H409 Unspecified glaucoma: Secondary | ICD-10-CM | POA: Diagnosis not present

## 2020-01-15 DIAGNOSIS — H401122 Primary open-angle glaucoma, left eye, moderate stage: Secondary | ICD-10-CM | POA: Diagnosis not present

## 2020-01-23 DIAGNOSIS — E039 Hypothyroidism, unspecified: Secondary | ICD-10-CM | POA: Diagnosis not present

## 2020-01-29 DIAGNOSIS — R69 Illness, unspecified: Secondary | ICD-10-CM | POA: Diagnosis not present

## 2020-02-27 DIAGNOSIS — H401111 Primary open-angle glaucoma, right eye, mild stage: Secondary | ICD-10-CM | POA: Diagnosis not present

## 2020-02-27 DIAGNOSIS — H401122 Primary open-angle glaucoma, left eye, moderate stage: Secondary | ICD-10-CM | POA: Diagnosis not present

## 2020-02-27 DIAGNOSIS — E039 Hypothyroidism, unspecified: Secondary | ICD-10-CM | POA: Diagnosis not present

## 2020-02-27 DIAGNOSIS — I10 Essential (primary) hypertension: Secondary | ICD-10-CM | POA: Diagnosis not present

## 2020-02-29 ENCOUNTER — Other Ambulatory Visit (INDEPENDENT_AMBULATORY_CARE_PROVIDER_SITE_OTHER): Payer: Self-pay | Admitting: *Deleted

## 2020-02-29 ENCOUNTER — Telehealth (INDEPENDENT_AMBULATORY_CARE_PROVIDER_SITE_OTHER): Payer: Self-pay | Admitting: Internal Medicine

## 2020-02-29 DIAGNOSIS — K219 Gastro-esophageal reflux disease without esophagitis: Secondary | ICD-10-CM

## 2020-02-29 MED ORDER — ESOMEPRAZOLE MAGNESIUM 40 MG PO CPDR: DELAYED_RELEASE_CAPSULE | ORAL | 3 refills | Status: DC

## 2020-02-29 NOTE — Telephone Encounter (Signed)
Patient called the office regarding a refill request for nexium 40 mg - 90 day supply from [facility] - states they have never received prescription - please advise - ph# [phone]

## 2020-02-29 NOTE — Telephone Encounter (Signed)
Prescription has been sent to the patients pharmacy. Please let him know.

## 2020-03-05 ENCOUNTER — Telehealth: Payer: Self-pay | Admitting: Family Medicine

## 2020-03-05 NOTE — Telephone Encounter (Signed)
I called the patient and scheduled an appointment for 03/06/20 at 2:00 pm with Dr. Junius Roads.

## 2020-03-05 NOTE — Telephone Encounter (Signed)
Please advise. Previous injection was on 01/03/20 (Kienboch's).

## 2020-03-05 NOTE — Telephone Encounter (Signed)
One more would be ok.

## 2020-03-05 NOTE — Telephone Encounter (Signed)
Patient called asked if he can get another cortisone injection in his right wrist? The number to contact patient is [phone]

## 2020-03-06 ENCOUNTER — Other Ambulatory Visit: Payer: Self-pay

## 2020-03-06 ENCOUNTER — Ambulatory Visit (INDEPENDENT_AMBULATORY_CARE_PROVIDER_SITE_OTHER): Payer: Medicare HMO | Admitting: Family Medicine

## 2020-03-06 ENCOUNTER — Encounter: Payer: Self-pay | Admitting: Family Medicine

## 2020-03-06 ENCOUNTER — Ambulatory Visit: Payer: Self-pay

## 2020-03-06 DIAGNOSIS — M25531 Pain in right wrist: Secondary | ICD-10-CM | POA: Diagnosis not present

## 2020-03-06 DIAGNOSIS — M92211 Osteochondrosis (juvenile) of carpal lunate [Kienbock], right hand: Secondary | ICD-10-CM

## 2020-03-06 NOTE — Patient Instructions (Signed)
    Turmeric:  500 mg twice daily  Glucosamine Sulfate:  1,000 mg twice daily

## 2020-03-06 NOTE — Progress Notes (Signed)
Office Visit Note   Patient: Darryl Phillips           Date of Birth: 1945/07/25           MRN: [id number] Visit Date: 03/06/2020 Requested by: Sharilyn Sites, [place],  [place] [address] PCP: Sharilyn Sites, MD  Subjective: Chief Complaint  Patient presents with  . Right Wrist - Pain, Follow-up    Requests another cortisone injection    HPI: 74yo RHD M presenting to clinic for repeated right wrist injection. Patient has history of [place], and states that he had excellent relief from the previous cortisone injection. Unfortunately, he was working in his yard raking leaves, and feels as though he aggravated the joint again. He would like another injection today. He is otherwise doing well. He continues to play gold regularly, and try to be active.                ROS:   All other systems were reviewed and are negative.  Objective: Vital Signs: There were no vitals taken for this visit.  Physical Exam:  General:  Alert and oriented, in no acute distress. Pulm:  Breathing unlabored. Psy:  Normal mood, congruent affect. Skin:  Right wrist overlying skin intact, without erythema, bruising, or rashes.   Right wrist with swelling appreciated over lunate prominence, which is very tender to palpation. Significant tenderness within scapholunate joint, which feels widened.   Imaging: No results found.  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 Procedure performed as described below, which patient tolerated very well. Strict return precautions discussed.     Procedures: Right wrist Cortisone Injection:  Risks and benefits of procedure discussed, Patient opted to proceed. Verbal Consent obtained.  Timeout performed.  Skin prepped in a sterile fashion with betadine before further cleansing with alcohol. Ethyl Chloride was used for topical analgesia.  Right Scapholunate area was injected with 2cc 1%  Bupivocaine without epinephrine using a 27G, 1.5in needle. Syringe was removed from the needle, and 6mg  betamethasone was then injected into the joint.   Patient tolerated the injection well with no immediate complications. Aftercare instructionswere discussed, and patient was given strict return precautions.    PMFS History: Patient Active Problem List   Diagnosis Date Noted  . Osteochondrosis of lunate of right wrist 03/06/2020  . Hemorrhoids 12/20/2018  . Fatigue 11/28/2018  . Hematochezia 11/28/2018  . Special screening for malignant neoplasms, colon 11/11/2017  . History of malignant melanoma 01/24/2014  . Irritable bowel syndrome 01/24/2014  . GERD (gastroesophageal reflux disease) 11/21/2013  . Unspecified hypothyroidism 11/21/2013  . Glaucoma 11/21/2013   Past Medical History:  Diagnosis Date  . Cancer ([place])    Melanoma back 2009  . Glaucoma   . Shingles   . Thyroid disease     History reviewed. No pertinent family history.  Past Surgical History:  Procedure Laterality Date  . cataract surgery     [facility] 2018. Also has a stent in his eye during surgery  . CHOLECYSTECTOMY     Dr. Arnoldo Morale  . COLONOSCOPY N/A 03/28/2018   Procedure: COLONOSCOPY;  Surgeon: Rogene Houston, MD;  [facility];  Service: Endoscopy;  Laterality: N/A;  830 - pt agreed to move per Tammy  . ESOPHAGOGASTRODUODENOSCOPY N/A 12/14/2013   Procedure: ESOPHAGOGASTRODUODENOSCOPY (EGD);  Surgeon: Rogene Houston, MD;  [facility];  Service: Endoscopy;  Laterality: N/A;  300  .  melonoma     x 4 sugeries  . POLYPECTOMY  03/28/2018   Procedure: POLYPECTOMY;  Surgeon: Rogene Houston, MD;  [facility];  Service: Endoscopy;;  colon   Social History   Occupational History  . Not on file  Tobacco Use  . Smoking status: Former Smoker    Quit date: 01/23/1969    Years since quitting: 51.1  . Smokeless tobacco: Never Used  Substance and Sexual Activity   . Alcohol use: No    Alcohol/week: 0.0 standard drinks  . Drug use: No  . Sexual activity: Not on file

## 2020-03-06 NOTE — Progress Notes (Signed)
I saw and examined the patient with Dr. Elouise Munroe and agree with assessment and plan as outlined.    Recurrent right wrist pain.  Good relief from injection in October until raked too many leaves.  Injected scapholunate area today.  Return as needed.

## 2020-03-13 DIAGNOSIS — H34832 Tributary (branch) retinal vein occlusion, left eye, with macular edema: Secondary | ICD-10-CM | POA: Diagnosis not present

## 2020-03-21 DIAGNOSIS — Z1331 Encounter for screening for depression: Secondary | ICD-10-CM | POA: Diagnosis not present

## 2020-03-21 DIAGNOSIS — I10 Essential (primary) hypertension: Secondary | ICD-10-CM | POA: Diagnosis not present

## 2020-03-21 DIAGNOSIS — H409 Unspecified glaucoma: Secondary | ICD-10-CM | POA: Diagnosis not present

## 2020-03-21 DIAGNOSIS — Z6827 Body mass index (BMI) 27.0-27.9, adult: Secondary | ICD-10-CM | POA: Diagnosis not present

## 2020-03-21 DIAGNOSIS — E663 Overweight: Secondary | ICD-10-CM | POA: Diagnosis not present

## 2020-04-10 DIAGNOSIS — H34832 Tributary (branch) retinal vein occlusion, left eye, with macular edema: Secondary | ICD-10-CM | POA: Diagnosis not present

## 2020-04-19 DIAGNOSIS — E781 Pure hyperglyceridemia: Secondary | ICD-10-CM | POA: Diagnosis not present

## 2020-04-19 DIAGNOSIS — E039 Hypothyroidism, unspecified: Secondary | ICD-10-CM | POA: Diagnosis not present

## 2020-04-19 DIAGNOSIS — E663 Overweight: Secondary | ICD-10-CM | POA: Diagnosis not present

## 2020-04-19 DIAGNOSIS — E063 Autoimmune thyroiditis: Secondary | ICD-10-CM | POA: Diagnosis not present

## 2020-04-19 DIAGNOSIS — K589 Irritable bowel syndrome without diarrhea: Secondary | ICD-10-CM | POA: Diagnosis not present

## 2020-04-19 DIAGNOSIS — K219 Gastro-esophageal reflux disease without esophagitis: Secondary | ICD-10-CM | POA: Diagnosis not present

## 2020-04-19 DIAGNOSIS — Z1389 Encounter for screening for other disorder: Secondary | ICD-10-CM | POA: Diagnosis not present

## 2020-04-19 DIAGNOSIS — H409 Unspecified glaucoma: Secondary | ICD-10-CM | POA: Diagnosis not present

## 2020-04-19 DIAGNOSIS — Z6827 Body mass index (BMI) 27.0-27.9, adult: Secondary | ICD-10-CM | POA: Diagnosis not present

## 2020-04-19 DIAGNOSIS — Z125 Encounter for screening for malignant neoplasm of prostate: Secondary | ICD-10-CM | POA: Diagnosis not present

## 2020-04-19 DIAGNOSIS — I10 Essential (primary) hypertension: Secondary | ICD-10-CM | POA: Diagnosis not present

## 2020-04-19 DIAGNOSIS — Z0001 Encounter for general adult medical examination with abnormal findings: Secondary | ICD-10-CM | POA: Diagnosis not present

## 2020-04-19 DIAGNOSIS — M1990 Unspecified osteoarthritis, unspecified site: Secondary | ICD-10-CM | POA: Diagnosis not present

## 2020-04-24 ENCOUNTER — Ambulatory Visit (INDEPENDENT_AMBULATORY_CARE_PROVIDER_SITE_OTHER): Payer: Medicare HMO | Admitting: Gastroenterology

## 2020-05-08 DIAGNOSIS — H34832 Tributary (branch) retinal vein occlusion, left eye, with macular edema: Secondary | ICD-10-CM | POA: Diagnosis not present

## 2020-05-15 DIAGNOSIS — M5136 Other intervertebral disc degeneration, lumbar region: Secondary | ICD-10-CM | POA: Diagnosis not present

## 2020-05-15 DIAGNOSIS — Z6827 Body mass index (BMI) 27.0-27.9, adult: Secondary | ICD-10-CM | POA: Diagnosis not present

## 2020-05-15 DIAGNOSIS — E663 Overweight: Secondary | ICD-10-CM | POA: Diagnosis not present

## 2020-05-29 ENCOUNTER — Encounter: Payer: Self-pay | Admitting: Family Medicine

## 2020-05-29 ENCOUNTER — Ambulatory Visit (INDEPENDENT_AMBULATORY_CARE_PROVIDER_SITE_OTHER): Payer: Medicare HMO | Admitting: Family Medicine

## 2020-05-29 ENCOUNTER — Other Ambulatory Visit: Payer: Self-pay

## 2020-05-29 DIAGNOSIS — M25531 Pain in right wrist: Secondary | ICD-10-CM

## 2020-05-29 NOTE — Progress Notes (Signed)
I saw and examined the patient with Dr. Elouise Munroe and agree with assessment and plan as outlined.    Recurrent right wrist pain.  Good relief from injection in December, but unfortunately short-lived.  Will reinject today with steroid.  If still short-term relief, then possibly try dextrose.

## 2020-05-29 NOTE — Progress Notes (Signed)
Office Visit Note   Patient: Darryl Phillips           Date of Birth: Mar 29, 1946           MRN: [id number] Visit Date: 05/29/2020 Requested by: Sharilyn Sites, [address] [place],  [place] [address] PCP: Sharilyn Sites, MD  Subjective: Chief Complaint  Patient presents with  . Right Wrist - Pain    HPI: 75yo RHD male with PMHx of wrist avascular necrosis presenting to clinic for repeat wrist steroid injection. Last injection in December, approx 62mo ago, which he states offered several weeks of significant relief. His pain has started to return, however, and he would like to know if he can have another steroid shot. He is otherwise doing well, with no additional concerns.               ROS:   All other systems were reviewed and are negative.  Objective: Vital Signs: There were no vitals taken for this visit.  Physical Exam:  General:  Alert and oriented, in no acute distress. Pulm:  Breathing unlabored. Psy:  Normal mood, congruent affect. Skin:  Right wrist with no bruising, erythema, or rashes. Overlying skin intact.   Right wrist with reduced ROM in flexion and extension due to pain, though strength is preserved. Full ROM in all fingers, with no rotational abnormality.  Tenderness to palpation over dorsal aspect of wrist, most notably over scapho-lunate area. Brisk distal capillary refill, and sensation intact throughout hands.   Imaging: No results found.  Assessment & Plan: 75yo RHD M presenting to clinic for repeat CSI of right wrist. Injection therapy performed as described below, which patient tolerated very well. He voiced immediate improvement in symptoms during anesthetic phase.  -Aftercare and return precautions discussed - Patient had no further questions or concerns.      Procedures: Right Wrist Cortisone Injection:  Risks and benefits of procedure discussed, Patient opted to proceed. Verbal Consent obtained.  Timeout performed.  Skin prepped in a  sterile fashion with betadine before further cleansing with alcohol. Ethyl Chloride was used for topical analgesia.  Right Wrist was injected with 3cc 0.25% bupivacaine without epinephrine using a 25G, 1.5in needle. Syringe was removed from the needle, and 6mg  betamethasone was then injected into the joint.   Patient tolerated the injection well with no immediate complications. Aftercare instructions were discussed, and patient was given strict return precautions.     PMFS History: Patient Active Problem List   Diagnosis Date Noted  . Osteochondrosis of lunate of right wrist 03/06/2020  . Hemorrhoids 12/20/2018  . Fatigue 11/28/2018  . Hematochezia 11/28/2018  . Special screening for malignant neoplasms, colon 11/11/2017  . History of malignant melanoma 01/24/2014  . Irritable bowel syndrome 01/24/2014  . GERD (gastroesophageal reflux disease) 11/21/2013  . Unspecified hypothyroidism 11/21/2013  . Glaucoma 11/21/2013   Past Medical History:  Diagnosis Date  . Cancer ([place])    Melanoma back 2009  . Glaucoma   . Shingles   . Thyroid disease     No family history on file.  Past Surgical History:  Procedure Laterality Date  . cataract surgery     [facility] 2018. Also has a stent in his eye during surgery  . CHOLECYSTECTOMY     Dr. Arnoldo Morale  . COLONOSCOPY N/A 03/28/2018   Procedure: COLONOSCOPY;  Surgeon: Rogene Houston, MD;  [facility];  Service: Endoscopy;  Laterality: N/A;  830 - pt agreed to move per  Tammy  . ESOPHAGOGASTRODUODENOSCOPY N/A 12/14/2013   Procedure: ESOPHAGOGASTRODUODENOSCOPY (EGD);  Surgeon: Rogene Houston, MD;  [facility];  Service: Endoscopy;  Laterality: N/A;  300  . melonoma     x 4 sugeries  . POLYPECTOMY  03/28/2018   Procedure: POLYPECTOMY;  Surgeon: Rogene Houston, MD;  [facility];  Service: Endoscopy;;  colon   Social History   Occupational History  . Not on file  Tobacco Use  . Smoking status:  Former Smoker    Quit date: 01/23/1969    Years since quitting: 51.3  . Smokeless tobacco: Never Used  Substance and Sexual Activity  . Alcohol use: No    Alcohol/week: 0.0 standard drinks  . Drug use: No  . Sexual activity: Not on file

## 2020-06-12 DIAGNOSIS — H401111 Primary open-angle glaucoma, right eye, mild stage: Secondary | ICD-10-CM | POA: Diagnosis not present

## 2020-06-19 DIAGNOSIS — H34832 Tributary (branch) retinal vein occlusion, left eye, with macular edema: Secondary | ICD-10-CM | POA: Diagnosis not present

## 2020-06-26 DIAGNOSIS — I10 Essential (primary) hypertension: Secondary | ICD-10-CM | POA: Diagnosis not present

## 2020-06-26 DIAGNOSIS — E039 Hypothyroidism, unspecified: Secondary | ICD-10-CM | POA: Diagnosis not present

## 2020-07-04 ENCOUNTER — Ambulatory Visit (INDEPENDENT_AMBULATORY_CARE_PROVIDER_SITE_OTHER): Payer: Medicare HMO | Admitting: Gastroenterology

## 2020-07-08 DIAGNOSIS — Z1283 Encounter for screening for malignant neoplasm of skin: Secondary | ICD-10-CM | POA: Diagnosis not present

## 2020-07-08 DIAGNOSIS — D485 Neoplasm of uncertain behavior of skin: Secondary | ICD-10-CM | POA: Diagnosis not present

## 2020-07-08 DIAGNOSIS — L820 Inflamed seborrheic keratosis: Secondary | ICD-10-CM | POA: Diagnosis not present

## 2020-07-08 DIAGNOSIS — L570 Actinic keratosis: Secondary | ICD-10-CM | POA: Diagnosis not present

## 2020-07-08 DIAGNOSIS — Z85828 Personal history of other malignant neoplasm of skin: Secondary | ICD-10-CM | POA: Diagnosis not present

## 2020-07-08 DIAGNOSIS — Z85820 Personal history of malignant melanoma of skin: Secondary | ICD-10-CM | POA: Diagnosis not present

## 2020-07-08 DIAGNOSIS — L859 Epidermal thickening, unspecified: Secondary | ICD-10-CM | POA: Diagnosis not present

## 2020-07-09 DIAGNOSIS — I10 Essential (primary) hypertension: Secondary | ICD-10-CM | POA: Diagnosis not present

## 2020-07-09 DIAGNOSIS — Z6827 Body mass index (BMI) 27.0-27.9, adult: Secondary | ICD-10-CM | POA: Diagnosis not present

## 2020-07-09 DIAGNOSIS — M47816 Spondylosis without myelopathy or radiculopathy, lumbar region: Secondary | ICD-10-CM | POA: Diagnosis not present

## 2020-07-09 DIAGNOSIS — E063 Autoimmune thyroiditis: Secondary | ICD-10-CM | POA: Diagnosis not present

## 2020-07-09 DIAGNOSIS — K219 Gastro-esophageal reflux disease without esophagitis: Secondary | ICD-10-CM | POA: Diagnosis not present

## 2020-07-17 DIAGNOSIS — H34832 Tributary (branch) retinal vein occlusion, left eye, with macular edema: Secondary | ICD-10-CM | POA: Diagnosis not present

## 2020-08-14 DIAGNOSIS — H34832 Tributary (branch) retinal vein occlusion, left eye, with macular edema: Secondary | ICD-10-CM | POA: Diagnosis not present

## 2020-08-14 DIAGNOSIS — H401111 Primary open-angle glaucoma, right eye, mild stage: Secondary | ICD-10-CM | POA: Diagnosis not present

## 2020-08-27 DIAGNOSIS — I10 Essential (primary) hypertension: Secondary | ICD-10-CM | POA: Diagnosis not present

## 2020-08-27 DIAGNOSIS — E039 Hypothyroidism, unspecified: Secondary | ICD-10-CM | POA: Diagnosis not present

## 2020-09-25 DIAGNOSIS — H34832 Tributary (branch) retinal vein occlusion, left eye, with macular edema: Secondary | ICD-10-CM | POA: Diagnosis not present

## 2020-09-26 DIAGNOSIS — I10 Essential (primary) hypertension: Secondary | ICD-10-CM | POA: Diagnosis not present

## 2020-09-26 DIAGNOSIS — E039 Hypothyroidism, unspecified: Secondary | ICD-10-CM | POA: Diagnosis not present

## 2020-10-23 DIAGNOSIS — H34832 Tributary (branch) retinal vein occlusion, left eye, with macular edema: Secondary | ICD-10-CM | POA: Diagnosis not present

## 2020-11-04 DIAGNOSIS — Z6827 Body mass index (BMI) 27.0-27.9, adult: Secondary | ICD-10-CM | POA: Diagnosis not present

## 2020-11-04 DIAGNOSIS — M5410 Radiculopathy, site unspecified: Secondary | ICD-10-CM | POA: Diagnosis not present

## 2020-11-04 DIAGNOSIS — E663 Overweight: Secondary | ICD-10-CM | POA: Diagnosis not present

## 2020-11-20 DIAGNOSIS — H34832 Tributary (branch) retinal vein occlusion, left eye, with macular edema: Secondary | ICD-10-CM | POA: Diagnosis not present

## 2020-12-18 DIAGNOSIS — H401111 Primary open-angle glaucoma, right eye, mild stage: Secondary | ICD-10-CM | POA: Diagnosis not present

## 2021-01-01 DIAGNOSIS — H401122 Primary open-angle glaucoma, left eye, moderate stage: Secondary | ICD-10-CM | POA: Diagnosis not present

## 2021-01-01 DIAGNOSIS — H34832 Tributary (branch) retinal vein occlusion, left eye, with macular edema: Secondary | ICD-10-CM | POA: Diagnosis not present

## 2021-01-06 DIAGNOSIS — L570 Actinic keratosis: Secondary | ICD-10-CM | POA: Diagnosis not present

## 2021-01-28 DIAGNOSIS — Z6827 Body mass index (BMI) 27.0-27.9, adult: Secondary | ICD-10-CM | POA: Diagnosis not present

## 2021-01-28 DIAGNOSIS — E039 Hypothyroidism, unspecified: Secondary | ICD-10-CM | POA: Diagnosis not present

## 2021-01-28 DIAGNOSIS — M1990 Unspecified osteoarthritis, unspecified site: Secondary | ICD-10-CM | POA: Diagnosis not present

## 2021-01-28 DIAGNOSIS — E890 Postprocedural hypothyroidism: Secondary | ICD-10-CM | POA: Diagnosis not present

## 2021-02-04 ENCOUNTER — Encounter: Payer: Self-pay | Admitting: Orthopedic Surgery

## 2021-02-04 ENCOUNTER — Ambulatory Visit: Payer: Medicare HMO | Admitting: Orthopedic Surgery

## 2021-02-04 ENCOUNTER — Other Ambulatory Visit: Payer: Self-pay

## 2021-02-04 DIAGNOSIS — M65342 Trigger finger, left ring finger: Secondary | ICD-10-CM

## 2021-02-04 DIAGNOSIS — M19031 Primary osteoarthritis, right wrist: Secondary | ICD-10-CM | POA: Diagnosis not present

## 2021-02-04 MED ORDER — METHYLPREDNISOLONE ACETATE 40 MG/ML IJ SUSP
40.0000 mg | INTRAMUSCULAR | Status: AC | PRN
Start: 2021-02-04 — End: 2021-02-04
  Administered 2021-02-04: 40 mg via INTRA_ARTICULAR

## 2021-02-04 MED ORDER — LIDOCAINE HCL 1 % IJ SOLN
1.0000 mL | INTRAMUSCULAR | Status: AC | PRN
  Administered 2021-02-04: 1 mL

## 2021-02-04 MED ORDER — BETAMETHASONE SOD PHOS & ACET 6 (3-3) MG/ML IJ SUSP
6.0000 mg | INTRAMUSCULAR | Status: AC | PRN
  Administered 2021-02-04: 6 mg via INTRA_ARTICULAR

## 2021-02-04 NOTE — Progress Notes (Signed)
Office Visit Note   Patient: Darryl Phillips           Date of Birth: 1945-04-26           MRN: [id number] Visit Date: 02/04/2021              Requested by: Sharilyn Sites, [address] [place],  [place] [address] PCP: Sharilyn Sites, MD   Assessment & Plan: Visit Diagnoses:  1. Primary osteoarthritis of right wrist   2. Trigger finger, left ring finger     Plan: We discussed the nature of trigger finger and the various treatment options including corticosteroid injection and A1 pulley release.  Given its only been 3 weeks, he wants to try corticosteroid injection first.  We also discussed his right wrist.  He is got significant arthritis involving the radius scaphoid and radiolunate articulations as well as the midcarpal joints.  He previously underwent corticosteroid injection into the wrist with significant symptom relief.  He wants to try this again today.  We discussed other treatment options for his wrist including activity modification, mobilization, corticosteroid injections, oral anti-inflammatories, and arthrodesis.  He was to continue with nonoperative management for now.  We can see him back in 4 to 6 weeks if his trigger finger has not resolved.  Follow-Up Instructions: No follow-ups on file.   Orders:  No orders of the defined types were placed in this encounter.  No orders of the defined types were placed in this encounter.     Procedures: Hand/UE Inj: L ring A1 for trigger finger on 02/04/2021 3:11 PM Indications: tendon swelling Details: 25 G needle, volar approach Medications: 1 mL lidocaine 1 %; 6 mg betamethasone acetate-betamethasone sodium phosphate 6 (3-3) MG/ML Outcome: tolerated well, no immediate complications Procedure, treatment alternatives, risks and benefits explained, specific risks discussed. Patient was prepped and draped in the usual sterile fashion.    Medium Joint Inj: R radiocarpal on 02/04/2021 3:13 PM Indications: pain Details: 25 G  3.5 in needle, dorsal approach Medications: 1 mL lidocaine 1 %; 40 mg methylPREDNISolone acetate 40 MG/ML Outcome: tolerated well, no immediate complications Procedure, treatment alternatives, risks and benefits explained, specific risks discussed. Consent was given by the patient. Immediately prior to procedure a time out was called to verify the correct patient, procedure, equipment, support staff and site/side marked as required. Patient was prepped and draped in the usual sterile fashion.      Clinical Data: No additional findings.   Subjective: Chief Complaint  Patient presents with   Left Hand - Pain    This is a 75 year old right-hand-dominant male who presents with chronic right wrist pain and new onset of triggering involving the left ring finger.  The triggering is been going on for 3 weeks.  He woke up and noticed that his ring finger was getting locked in a flexed position.  He has to force the finger back into extension.  He denies any injury to this hand.  He has not had any treatment to date.  Regarding his right wrist, he says that this is a chronic issue for him.  He had previous corticosteroid injection to this wrist with significant symptom relief.  He is still able to play golf and do all his daily activities.   Review of Systems   Objective: Vital Signs: BP (!) 159/111 (BP Location: Left Arm, Patient Position: Sitting)   Pulse 82   Ht 5\' 10"  (1.778 m)   Wt 190 lb (86.2 kg)  BMI 27.26 kg/m   Physical Exam Constitutional:      Appearance: Normal appearance.  Cardiovascular:     Rate and Rhythm: Normal rate.     Pulses: Normal pulses.  Pulmonary:     Effort: Pulmonary effort is normal.  Skin:    General: Skin is warm and dry.     Capillary Refill: Capillary refill takes less than 2 seconds.  Neurological:     Mental Status: He is alert.    Right Hand Exam   Other  Erythema: absent Sensation: normal Pulse: present  Comments:  Significantly  limited ROM of the wrist. Able to make a full fist.    Left Hand Exam   Tenderness  Left hand tenderness location: TTP over ring finger A1 pulley.   Other  Erythema: absent Sensation: normal Pulse: present  Comments:  Palpable and visible triggering of ring finger.      Specialty Comments:  No specialty comments available.  Imaging: No new imaging today.  Previous imaging of the right wrist is reviewed interpreted by me.  Demonstrates significant osteoarthritis involving the radius scaphoid, radiolunate, and midcarpal articulations.   PMFS History: Patient Active Problem List   Diagnosis Date Noted   Degenerative arthritis of right wrist 02/04/2021   Trigger finger, left ring finger 02/04/2021   Osteochondrosis of lunate of right wrist 03/06/2020   Hemorrhoids 12/20/2018   Fatigue 11/28/2018   Hematochezia 11/28/2018   Special screening for malignant neoplasms, colon 11/11/2017   History of malignant melanoma 01/24/2014   Irritable bowel syndrome 01/24/2014   GERD (gastroesophageal reflux disease) 11/21/2013   Unspecified hypothyroidism 11/21/2013   Glaucoma 11/21/2013   Past Medical History:  Diagnosis Date   Cancer ([place])    Melanoma back 2009   Glaucoma    Shingles    Thyroid disease     No family history on file.  Past Surgical History:  Procedure Laterality Date   cataract surgery     [facility] 2018. Also has a stent in his eye during surgery   CHOLECYSTECTOMY     Dr. Arnoldo Morale   COLONOSCOPY N/A 03/28/2018   Procedure: COLONOSCOPY;  Surgeon: Rogene Houston, MD;  [facility];  Service: Endoscopy;  Laterality: N/A;  830 - pt agreed to move per Tammy   ESOPHAGOGASTRODUODENOSCOPY N/A 12/14/2013   Procedure: ESOPHAGOGASTRODUODENOSCOPY (EGD);  Surgeon: Rogene Houston, MD;  [facility];  Service: Endoscopy;  Laterality: N/A;  300   melonoma     x 4 sugeries   POLYPECTOMY  03/28/2018   Procedure: POLYPECTOMY;  Surgeon: Rogene Houston, MD;  [facility];  Service: Endoscopy;;  colon   Social History   Occupational History   Not on file  Tobacco Use   Smoking status: Former    Types: Cigarettes    Quit date: 01/23/1969    Years since quitting: 52.0   Smokeless tobacco: Never  Substance and Sexual Activity   Alcohol use: No    Alcohol/week: 0.0 standard drinks   Drug use: No   Sexual activity: Not on file

## 2021-02-12 DIAGNOSIS — H401122 Primary open-angle glaucoma, left eye, moderate stage: Secondary | ICD-10-CM | POA: Diagnosis not present

## 2021-02-12 DIAGNOSIS — H34832 Tributary (branch) retinal vein occlusion, left eye, with macular edema: Secondary | ICD-10-CM | POA: Diagnosis not present

## 2021-02-16 ENCOUNTER — Other Ambulatory Visit (INDEPENDENT_AMBULATORY_CARE_PROVIDER_SITE_OTHER): Payer: Self-pay | Admitting: Internal Medicine

## 2021-02-16 DIAGNOSIS — K219 Gastro-esophageal reflux disease without esophagitis: Secondary | ICD-10-CM

## 2021-02-18 ENCOUNTER — Other Ambulatory Visit (INDEPENDENT_AMBULATORY_CARE_PROVIDER_SITE_OTHER): Payer: Self-pay | Admitting: Internal Medicine

## 2021-02-18 DIAGNOSIS — K219 Gastro-esophageal reflux disease without esophagitis: Secondary | ICD-10-CM

## 2021-02-18 NOTE — Telephone Encounter (Signed)
Last seen 04/24/19. Needs office visit. Please schedule

## 2021-03-30 ENCOUNTER — Other Ambulatory Visit (INDEPENDENT_AMBULATORY_CARE_PROVIDER_SITE_OTHER): Payer: Self-pay | Admitting: Internal Medicine

## 2021-03-30 DIAGNOSIS — K219 Gastro-esophageal reflux disease without esophagitis: Secondary | ICD-10-CM

## 2021-04-04 DIAGNOSIS — H34832 Tributary (branch) retinal vein occlusion, left eye, with macular edema: Secondary | ICD-10-CM | POA: Diagnosis not present

## 2021-04-08 DIAGNOSIS — Z6826 Body mass index (BMI) 26.0-26.9, adult: Secondary | ICD-10-CM | POA: Diagnosis not present

## 2021-04-08 DIAGNOSIS — E890 Postprocedural hypothyroidism: Secondary | ICD-10-CM | POA: Diagnosis not present

## 2021-04-08 DIAGNOSIS — J329 Chronic sinusitis, unspecified: Secondary | ICD-10-CM | POA: Diagnosis not present

## 2021-04-08 DIAGNOSIS — M1990 Unspecified osteoarthritis, unspecified site: Secondary | ICD-10-CM | POA: Diagnosis not present

## 2021-04-08 DIAGNOSIS — I10 Essential (primary) hypertension: Secondary | ICD-10-CM | POA: Diagnosis not present

## 2021-04-08 DIAGNOSIS — E663 Overweight: Secondary | ICD-10-CM | POA: Diagnosis not present

## 2021-05-01 DIAGNOSIS — E663 Overweight: Secondary | ICD-10-CM | POA: Diagnosis not present

## 2021-05-01 DIAGNOSIS — Z6826 Body mass index (BMI) 26.0-26.9, adult: Secondary | ICD-10-CM | POA: Diagnosis not present

## 2021-05-01 DIAGNOSIS — S39012A Strain of muscle, fascia and tendon of lower back, initial encounter: Secondary | ICD-10-CM | POA: Diagnosis not present

## 2021-05-01 DIAGNOSIS — M5136 Other intervertebral disc degeneration, lumbar region: Secondary | ICD-10-CM | POA: Diagnosis not present

## 2021-05-06 ENCOUNTER — Other Ambulatory Visit (INDEPENDENT_AMBULATORY_CARE_PROVIDER_SITE_OTHER): Payer: Self-pay | Admitting: Internal Medicine

## 2021-05-06 DIAGNOSIS — K219 Gastro-esophageal reflux disease without esophagitis: Secondary | ICD-10-CM

## 2021-05-30 DIAGNOSIS — H34832 Tributary (branch) retinal vein occlusion, left eye, with macular edema: Secondary | ICD-10-CM | POA: Diagnosis not present

## 2021-06-01 ENCOUNTER — Other Ambulatory Visit (INDEPENDENT_AMBULATORY_CARE_PROVIDER_SITE_OTHER): Payer: Self-pay | Admitting: Internal Medicine

## 2021-06-01 DIAGNOSIS — K219 Gastro-esophageal reflux disease without esophagitis: Secondary | ICD-10-CM

## 2021-06-03 ENCOUNTER — Other Ambulatory Visit: Payer: Self-pay

## 2021-06-03 ENCOUNTER — Ambulatory Visit: Payer: Medicare HMO | Admitting: Orthopedic Surgery

## 2021-06-03 DIAGNOSIS — M19031 Primary osteoarthritis, right wrist: Secondary | ICD-10-CM

## 2021-06-03 DIAGNOSIS — M65342 Trigger finger, left ring finger: Secondary | ICD-10-CM

## 2021-06-03 MED ORDER — METHYLPREDNISOLONE ACETATE 40 MG/ML IJ SUSP
40.0000 mg | INTRAMUSCULAR | Status: AC | PRN
  Administered 2021-06-03: 40 mg via INTRA_ARTICULAR

## 2021-06-03 MED ORDER — LIDOCAINE HCL 1 % IJ SOLN
1.0000 mL | INTRAMUSCULAR | Status: AC | PRN
  Administered 2021-06-03: 1 mL

## 2021-06-03 MED ORDER — BETAMETHASONE SOD PHOS & ACET 6 (3-3) MG/ML IJ SUSP
6.0000 mg | INTRAMUSCULAR | Status: AC | PRN
  Administered 2021-06-03: 6 mg via INTRA_ARTICULAR

## 2021-06-03 NOTE — Progress Notes (Signed)
? ?Office Visit Note ?  ?Patient: Darryl Phillips           ?Date of Birth: Mar 03, 1946           ?MRN: [id number] ?Visit Date: 06/03/2021 ?             ?Requested by: Sharilyn Sites, MD ?[address] ?[place],  [place] [address] ?PCP: Sharilyn Sites, MD ? ? ?Assessment & Plan: ?Visit Diagnoses:  ?1. Primary osteoarthritis of right wrist   ?2. Trigger finger, left ring finger   ? ? ?Plan: We we again reviewed the nature of both radiocarpal arthritis and trigger finger.  He has gotten significant relief from radiocarpal injections with the last injection lasting 4 months.  He also had similar relief from his last trigger finger injection.  He like to proceed with a repeat injection today.  He is not ready to discuss surgical treatment.  I can see him back in the office once these injections wear off. ? ?Follow-Up Instructions: No follow-ups on file.  ? ?Orders:  ?No orders of the defined types were placed in this encounter. ? ?No orders of the defined types were placed in this encounter. ? ? ? ? Procedures: ?Hand/UE Inj: L ring A1 for trigger finger on 06/03/2021 2:53 PM ?Indications: therapeutic and tendon swelling ?Details: 25 G needle, volar approach ?Medications: 1 mL lidocaine 1 %; 6 mg betamethasone acetate-betamethasone sodium phosphate 6 (3-3) MG/ML ?Outcome: tolerated well, no immediate complications ?Consent was given by the patient. Immediately prior to procedure a time out was called to verify the correct patient, procedure, equipment, support staff and site/side marked as required. Patient was prepped and draped in the usual sterile fashion.  ? ? ?Medium Joint Inj: R radiocarpal on 06/03/2021 2:54 PM ?Indications: pain ?Details: 25 G 1.5 in needle, dorsal approach ?Medications: 1 mL lidocaine 1 %; 40 mg methylPREDNISolone acetate 40 MG/ML ?Outcome: tolerated well, no immediate complications ?Procedure, treatment alternatives, risks and benefits explained, specific risks discussed. Consent was given by the  patient. Immediately prior to procedure a time out was called to verify the correct patient, procedure, equipment, support staff and site/side marked as required. Patient was prepped and draped in the usual sterile fashion.  ? ? ? ? ?Clinical Data: ?No additional findings. ? ? ?Subjective: ?Chief Complaint  ?Patient presents with  ? Right Wrist - Follow-up  ?  Would like to get the same injections in the wrist and the trigger finger ?  ? ? ?This is a 76 year old right-hand-dominant male who presents for follow-up of chronic right wrist pain and left ring finger triggering.  The triggering started around the time of his first visit in November.  He had a corticosteroid injection at that time which resolved his symptoms for several months.  He also underwent corticosteroid injection in the right wrist which has given him about 4 months of relief.  He is still playing golf, doing yard work, and maintaining his active lifestyle.  ? ? ?Review of Systems ? ? ?Objective: ?Vital Signs: There were no vitals taken for this visit. ? ?Physical Exam ? ?Right Hand Exam  ? ?Tenderness  ?The patient is experiencing no tenderness.  ? ?Other  ?Erythema: absent ?Sensation: normal ?Pulse: present ? ?Comments:  Severely limited ROM secondary to radiocarpal arthritis.  Mild swelling.  Full ROM of all fingers.  ? ? ?Left Hand Exam  ? ?Tenderness  ?The patient is experiencing no tenderness.  ? ?Other  ?Erythema: absent ?Sensation: normal ?Pulse: present ? ?  Comments:  Palpable and visible triggering of ring finger.  No triggering of other digits.  ? ? ? ? ?Specialty Comments:  ?No specialty comments available. ? ?Imaging: ?No results found. ? ? ?PMFS History: ?Patient Active Problem List  ? Diagnosis Date Noted  ? Degenerative arthritis of right wrist 02/04/2021  ? Trigger finger, left ring finger 02/04/2021  ? 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 Hemorrhoids 12/20/2018  ? Fatigue 11/28/2018  ? Hematochezia 11/28/2018  ?  Special screening for malignant neoplasms, colon 11/11/2017  ? History of malignant melanoma 01/24/2014  ? Irritable bowel syndrome 01/24/2014  ? GERD (gastroesophageal reflux disease) 11/21/2013  ? Unspecified hypothyroidism 11/21/2013  ? Glaucoma 11/21/2013  ? ?Past Medical History:  ?Diagnosis Date  ? Cancer [facility])   ? Melanoma back 2009  ? Glaucoma   ? Shingles   ? Thyroid disease   ?  ?No family history on file.  ?Past Surgical History:  ?Procedure Laterality Date  ? cataract surgery    ? [facility] 2018. Also has a stent in his eye during surgery  ? CHOLECYSTECTOMY    ? Dr. Arnoldo Morale  ? COLONOSCOPY N/A 03/28/2018  ? Procedure: COLONOSCOPY;  Surgeon: Rogene Houston, MD;  [facility];  Service: Endoscopy;  Laterality: N/A;  830 - pt agreed to move per Tammy  ? ESOPHAGOGASTRODUODENOSCOPY N/A 12/14/2013  ? Procedure: ESOPHAGOGASTRODUODENOSCOPY (EGD);  Surgeon: Rogene Houston, MD;  [facility];  Service: Endoscopy;  Laterality: N/A;  300  ? melonoma    ? x 4 sugeries  ? POLYPECTOMY  03/28/2018  ? Procedure: POLYPECTOMY;  Surgeon: Rogene Houston, MD;  [facility];  Service: Endoscopy;;  colon  ? ?Social History  ? ?Occupational History  ? Not on file  ?Tobacco Use  ? Smoking status: Former  ?  Types: Cigarettes  ?  Quit date: 01/23/1969  ?  Years since quitting: 52.3  ? Smokeless tobacco: Never  ?Substance and Sexual Activity  ? Alcohol use: No  ?  Alcohol/week: 0.0 standard drinks  ? Drug use: No  ? Sexual activity: Not on file  ? ? ? ? ? ? ?

## 2021-06-12 ENCOUNTER — Ambulatory Visit (INDEPENDENT_AMBULATORY_CARE_PROVIDER_SITE_OTHER): Payer: Medicare HMO | Admitting: Gastroenterology

## 2021-06-12 ENCOUNTER — Other Ambulatory Visit: Payer: Self-pay

## 2021-06-12 ENCOUNTER — Encounter (INDEPENDENT_AMBULATORY_CARE_PROVIDER_SITE_OTHER): Payer: Self-pay | Admitting: Gastroenterology

## 2021-06-12 VITALS — BP 172/93 | HR 69 | Temp 98.3°F | Ht 70.0 in | Wt 194.0 lb

## 2021-06-12 DIAGNOSIS — K219 Gastro-esophageal reflux disease without esophagitis: Secondary | ICD-10-CM

## 2021-06-12 DIAGNOSIS — K580 Irritable bowel syndrome with diarrhea: Secondary | ICD-10-CM | POA: Diagnosis not present

## 2021-06-12 MED ORDER — ESOMEPRAZOLE MAGNESIUM 40 MG PO CPDR: 40.0000 mg | DELAYED_RELEASE_CAPSULE | Freq: Every day | ORAL | 3 refills | Status: AC

## 2021-06-12 NOTE — Progress Notes (Signed)
Darryl Phillips, M.D. ?Gastroenterology & Hepatology ?[facility] ?[address] ?[place], [place] [address] ? ?Primary Care Physician: ?Sharilyn Sites, MD ?[address] ?[place] [address] ? ?I will communicate my assessment and recommendations to the referring MD via EMR. ? ?Problems: ?GERD ?IBS-D ? ?History of Present Illness: ?Darryl Phillips is a 76 y.o. male with past medical history of melanoma, glaucoma, IBS-D and GERD, who presents for follow up of IBS and GERD. ? ?The patient was last seen on 04/24/2019. At that time, the patient was undergoing evaluation for rectal bleeding which was considered to be secondary to constipation.  He was given refills for Nexium 40 mg every day. ? ?Patient reports that when he has some bad days he may have several episodes of diarrhea along with bloating in his abdomen. States that he takes some peptobismol when this happens and it improves his symptoms. He believes this is mostly related to dietary changes. Takes a probiotic two times a day. Has never tried Xifaxan, is not interested in this.  He reports that he overall feels good. Takes IBGard as needed for bloating. ? ?The patient denies having any nausea, vomiting, fever, chills, hematochezia, melena, hematemesis, jaundice, pruritus or weight loss. ? ?Last EGD: 2015 Nonerosive antral gastritis otherwise normal EGD. ?Biopsies taken from antral and post bulbar duodenal mucosa. ?Last Colonoscopy: 2019-diverticulosis in transverse, distal transverse and sigmoid.  4 mm polyp distal sigmoid.  7-year repeat recommended depending on age and comorbidities at that time. ? ?Past Medical History: ?Past Medical History:  ?Diagnosis Date  ? Cancer [facility])   ? Melanoma back 2009  ? Glaucoma   ? Shingles   ? Thyroid disease   ? ? ?Past Surgical History: ?Past Surgical History:  ?Procedure Laterality Date  ? cataract surgery    ? [employer] 2018. Also has a stent in his eye  during surgery  ? CHOLECYSTECTOMY    ? Dr. Arnoldo Morale  ? COLONOSCOPY N/A 03/28/2018  ? Procedure: COLONOSCOPY;  Surgeon: Rogene Houston, MD;  [facility];  Service: Endoscopy;  Laterality: N/A;  830 - pt agreed to move per Tammy  ? ESOPHAGOGASTRODUODENOSCOPY N/A 12/14/2013  ? Procedure: ESOPHAGOGASTRODUODENOSCOPY (EGD);  Surgeon: Rogene Houston, MD;  [facility];  Service: Endoscopy;  Laterality: N/A;  300  ? melonoma    ? x 4 sugeries  ? POLYPECTOMY  03/28/2018  ? Procedure: POLYPECTOMY;  Surgeon: Rogene Houston, MD;  [facility];  Service: Endoscopy;;  colon  ? ? ?Family History:History reviewed. No pertinent family history. ? ?Social History: ?Social History  ? ?Tobacco Use  ?Smoking Status Former  ? Types: Cigarettes  ? Quit date: 01/23/1969  ? Years since quitting: 52.4  ?Smokeless Tobacco Never  ? ?Social History  ? ?Substance and Sexual Activity  ?Alcohol Use No  ? Alcohol/week: 0.0 standard drinks  ? ?Social History  ? ?Substance and Sexual Activity  ?Drug Use No  ? ? ?Allergies: ?Allergies  ?Allergen Reactions  ? Azithromycin Nausea And Vomiting  ? Tetracyclines & Related   ?  Skin on tongue peels off   ? ? ?Medications: ?Current Outpatient Medications  ?Medication Sig Dispense Refill  ? bismuth subsalicylate (PEPTO BISMOL) 262 MG/15ML suspension Take 30 mLs by mouth every 6 (six) hours as needed for indigestion.     ? brimonidine (ALPHAGAN) 0.15 % ophthalmic solution Place 1 drop into the left eye 2 (two) times daily.    ?  dorzolamide-timolol (COSOPT) 22.3-6.8 MG/ML ophthalmic solution Place 1 drop into both eyes 2 (two) times daily.  5  ? esomeprazole (NEXIUM) 40 MG capsule TAKE 1 CAPSULE BY MOUTH IN THE MORNING BEFORE BREAKFAST 45 capsule 0  ? levothyroxine (SYNTHROID, LEVOTHROID) 125 MCG tablet Take 125 mcg by mouth daily before breakfast.    ? Multiple Vitamin (MULTIVITAMIN WITH MINERALS) TABS tablet Take 1 tablet by mouth daily.    ? olmesartan (BENICAR) 20 MG  tablet Take 20 mg by mouth daily.    ? Saw Palmetto 450 MG CAPS Take 450 mg by mouth daily.    ? VYZULTA 0.024 % SOLN SMARTSIG:1 Drop(s) In Eye(s) Every Evening    ? ?Current Facility-Administered Medications  ?Medication Dose Route Frequency Provider Last Rate Last Admin  ? methylPREDNISolone acetate (DEPO-MEDROL) injection 40 mg  40 mg Intra-articular Once Hilts, Legrand Como, MD      ? ? ?Review of Systems: ?GENERAL: negative for malaise, night sweats ?HEENT: No changes in hearing or vision, no nose bleeds or other nasal problems. ?NECK: Negative for lumps, goiter, pain and significant neck swelling ?RESPIRATORY: Negative for cough, wheezing ?CARDIOVASCULAR: Negative for chest pain, leg swelling, palpitations, orthopnea ?GI: SEE HPI ?MUSCULOSKELETAL: Negative for joint pain or swelling, back pain, and muscle pain. ?SKIN: Negative for lesions, rash ?PSYCH: Negative for sleep disturbance, mood disorder and recent psychosocial stressors. ?HEMATOLOGY Negative for prolonged bleeding, bruising easily, and swollen nodes. ?ENDOCRINE: Negative for cold or heat intolerance, polyuria, polydipsia and goiter. ?NEURO: negative for tremor, gait imbalance, syncope and seizures. ?The remainder of the review of systems is noncontributory. ? ? ?Physical Exam: ?BP (!) 172/93 (BP Location: Left Arm, Patient Position: Sitting, Cuff Size: Large)   Pulse 69   Temp 98.3 ?F (36.8 ?C) (Oral)   Ht '5\' 10"'$  (1.778 m)   Wt 194 lb (88 kg)   BMI 27.84 kg/m?  ?GENERAL: The patient is AO x3, in no acute distress. ?HEENT: Head is normocephalic and atraumatic. EOMI are intact. Mouth is well hydrated and without lesions. ?NECK: Supple. No masses ?LUNGS: Clear to auscultation. No presence of rhonchi/wheezing/rales. Adequate chest expansion ?HEART: RRR, normal s1 and s2. ?ABDOMEN: Soft, nontender, no guarding, no peritoneal signs, and nondistended. BS +. No masses. ?EXTREMITIES: Without any cyanosis, clubbing, rash, lesions or edema. ?NEUROLOGIC: AOx3,  no focal motor deficit. ?SKIN: no jaundice, no rashes ? ?Imaging/Labs: ?as above ? ?I personally reviewed and interpreted the available labs, imaging and endoscopic files. ? ?Impression and Plan: ?JAIVYN GULLA is a 76 y.o. male with past medical history of melanoma, glaucoma, IBS-D and GERD, who presents for follow up of IBS and GERD.  Patient has presented relatively well-controlled IBS which has responded to the use of peppermint compounds and Pepto-Bismol, which I consider adequate to keep taking -he is not interested in any other agent for now which is reasonable.  No red flag signs that would warrant further work-up.  He will benefit from getting a low FODMAP diet for now which he is agreeable to. ? ?Finally, he has adequately control his reflux with the use of Nexium which we will continue at the same dosage. ? ?- Continue Nexium 40 mg qday ?- Explained presumed etiology of IBS symptoms. Patient was counseled about the benefit of implementing a low FODMAP to improve symptoms and recurrent episodes. A dietary list was provided to the patient. ?- Continue IBGard as needed for bloating ?- Continue Peptobismol as needed for diarrhea ? ?All questions were answered.     ? ?  Harvel Quale, MD ?Gastroenterology and Hepatology ?[facility] ? ?

## 2021-06-12 NOTE — Patient Instructions (Signed)
Continue Nexium 40 mg qday ?Explained presumed etiology of IBS symptoms. Patient was counseled about the benefit of implementing a low FODMAP to improve symptoms and recurrent episodes. A dietary list was provided to the patient. ?Continue IBGard as needed for bloating ?Continue Peptobismol as needed for diarrhea ?

## 2021-06-23 ENCOUNTER — Ambulatory Visit: Payer: Medicare HMO | Admitting: Orthopaedic Surgery

## 2021-06-23 ENCOUNTER — Ambulatory Visit (INDEPENDENT_AMBULATORY_CARE_PROVIDER_SITE_OTHER): Payer: Medicare HMO

## 2021-06-23 DIAGNOSIS — M25559 Pain in unspecified hip: Secondary | ICD-10-CM

## 2021-06-23 DIAGNOSIS — M25552 Pain in left hip: Secondary | ICD-10-CM

## 2021-06-23 NOTE — Progress Notes (Signed)
The patient is a very pleasant 76 year old gentleman that I am seeing for the first time but he is established in this practice.  He comes in with a chief complaint of left hip stiffness and weakness with no known injury.  He says he does have some pain in occasion of the hip but is not really bad pain.  He denies any specific groin pain.  He says his harder problem is putting his shoes and socks on on his left side and crossing his left leg over his right.  He denies any issues with the right hip. ? ?His right hip moves smoothly and fluidly with no problems at all.  The left hip has good range of motion but certainly stiffness with the extremes of rotation and pain in the groin with extremes of rotation. ? ?An AP pelvis and lateral left hip shows just some subtle arthritic findings on the left side comparing the left and right side. ? ?He does have mild arthritis of his left hip and it could be getting close to moderate.  I have recommended outpatient physical therapy to strengthen muscles around the left hip.  Another step would be considering an intra-articular steroid injection of the left hip joint but his pain is not significant enough to try that yet.  He agrees with this treatment plan.  I did go over his x-rays with him.  All questions and concerns were answered and addressed.  We will see if we can schedule outpatient physical therapy in [place] where he is from. ?

## 2021-06-24 ENCOUNTER — Other Ambulatory Visit: Payer: Self-pay

## 2021-06-24 DIAGNOSIS — M25559 Pain in unspecified hip: Secondary | ICD-10-CM

## 2021-07-02 DIAGNOSIS — H401122 Primary open-angle glaucoma, left eye, moderate stage: Secondary | ICD-10-CM | POA: Diagnosis not present

## 2021-07-14 DIAGNOSIS — L570 Actinic keratosis: Secondary | ICD-10-CM | POA: Diagnosis not present

## 2021-07-16 ENCOUNTER — Encounter (HOSPITAL_COMMUNITY): Payer: Self-pay | Admitting: Physical Therapy

## 2021-07-16 ENCOUNTER — Ambulatory Visit (HOSPITAL_COMMUNITY): Payer: Medicare HMO | Attending: Orthopaedic Surgery | Admitting: Physical Therapy

## 2021-07-16 DIAGNOSIS — M25552 Pain in left hip: Secondary | ICD-10-CM | POA: Insufficient documentation

## 2021-07-16 DIAGNOSIS — M6281 Muscle weakness (generalized): Secondary | ICD-10-CM | POA: Diagnosis not present

## 2021-07-16 DIAGNOSIS — M25559 Pain in unspecified hip: Secondary | ICD-10-CM | POA: Insufficient documentation

## 2021-07-16 DIAGNOSIS — R2689 Other abnormalities of gait and mobility: Secondary | ICD-10-CM | POA: Insufficient documentation

## 2021-07-16 DIAGNOSIS — R29898 Other symptoms and signs involving the musculoskeletal system: Secondary | ICD-10-CM | POA: Insufficient documentation

## 2021-07-16 NOTE — Therapy (Signed)
?OUTPATIENT PHYSICAL THERAPY LOWER EXTREMITY EVALUATION ? ? ?Patient Name: Darryl Phillips ?MRN: [id number] ?DOB:11/15/1945, 76 y.o., male ?Today's Date: 07/16/2021 ? ? PT End of Session - 07/16/21 1359   ? ? Visit Number 1   ? Number of Visits 4   ? Date for PT Re-Evaluation 08/13/21   ? Authorization Type [employer] Medicare (no auth, no VL)   ? PT Start Time 1400   ? PT Stop Time 6222   ? PT Time Calculation (min) 42 min   ? Activity Tolerance Patient tolerated treatment well   ? Behavior During Therapy [facility] for tasks assessed/performed   ? ?  ?  ? ?  ? ? ?Past Medical History:  ?Diagnosis Date  ? Cancer [facility])   ? Melanoma back 2009  ? Glaucoma   ? Shingles   ? Thyroid disease   ? ?Past Surgical History:  ?Procedure Laterality Date  ? cataract surgery    ? [employer] 2018. Also has a stent in his eye during surgery  ? CHOLECYSTECTOMY    ? Dr. Arnoldo Morale  ? COLONOSCOPY N/A 03/28/2018  ? Procedure: COLONOSCOPY;  Surgeon: Rogene Houston, MD;  [facility];  Service: Endoscopy;  Laterality: N/A;  830 - pt agreed to move per Tammy  ? ESOPHAGOGASTRODUODENOSCOPY N/A 12/14/2013  ? Procedure: ESOPHAGOGASTRODUODENOSCOPY (EGD);  Surgeon: Rogene Houston, MD;  [facility];  Service: Endoscopy;  Laterality: N/A;  300  ? melonoma    ? x 4 sugeries  ? POLYPECTOMY  03/28/2018  ? Procedure: POLYPECTOMY;  Surgeon: Rogene Houston, MD;  [facility];  Service: Endoscopy;;  colon  ? ?Patient Active Problem List  ? Diagnosis Date Noted  ? Degenerative arthritis of right wrist 02/04/2021  ? Trigger finger, left ring finger 02/04/2021  ? 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 Hemorrhoids 12/20/2018  ? Fatigue 11/28/2018  ? Hematochezia 11/28/2018  ? Special screening for malignant neoplasms, colon 11/11/2017  ? History of malignant melanoma 01/24/2014  ? Irritable bowel syndrome 01/24/2014  ? GERD (gastroesophageal reflux disease) 11/21/2013  ? Unspecified hypothyroidism 11/21/2013  ?  Glaucoma 11/21/2013  ? ? ?PCP: Sharilyn Sites, MD ? ?REFERRING PROVIDER: Mcarthur Rossetti* ? ?REFERRING DIAG: M25.559 (ICD-10-CM) - Hip pain  ? ?THERAPY DIAG:  ?Pain in left hip ? ?Muscle weakness (generalized) ? ?Other abnormalities of gait and mobility ? ?Other symptoms and signs involving the musculoskeletal system ? ?ONSET DATE:  a couple of years ? ?SUBJECTIVE:  ? ?SUBJECTIVE STATEMENT: ?Patient states minimal impairment with the hip but it is present. Patient states most aggravating thing is pushing off L side with golf (he plays left handed).  ? ?PERTINENT HISTORY: ?N/a ? ?PAIN:  ?Are you having pain? Yes: NPRS scale: 2/10 ?Pain location: L hip ?Pain description: dull ?Aggravating factors: Golf ?Relieving factors: rest ? ?PRECAUTIONS: None ? ?WEIGHT BEARING RESTRICTIONS No ? ?FALLS:  ?Has patient fallen in last 6 months? No ? ?LIVING ENVIRONMENT: ?Lives with: lives with their family and lives alone ?Lives in: House/apartment ?Stairs: No ?Has following equipment at home: None ? ?OCCUPATION: Retired ? ?PLOF: Independent ? ?PATIENT GOALS feel better ? ? ?OBJECTIVE:  ? ?DIAGNOSTIC FINDINGS: XR 06/23/21 mild arthritic changes  ?with the left hip with only just slight joint space narrowing and small  ?periarticular osteophyte around the left hip ? ? ?PATIENT SURVEYS:  ?FOTO 72% function ? ?COGNITION: ? Overall cognitive status: Within functional limits for tasks assessed   ?  ?  SENSATION: ?WFL ? ? ?POSTURE:  ?Rounded shoulders ? ?PALPATION: ?No tenderness throughout L hip ? ?LE ROM:  WFL for tasks assessed, decreased lumbar extension ROM in prone ? ?Active ROM Right ?07/16/2021 Left ?07/16/2021  ?Hip flexion    ?Hip extension    ?Hip abduction    ?Hip adduction    ?Hip internal rotation    ?Hip external rotation    ?Knee flexion    ?Knee extension    ?Ankle dorsiflexion    ?Ankle plantarflexion    ?Ankle inversion    ?Ankle eversion    ? (Blank rows = not tested) ? ?LE MMT: ? ?MMT Right ?07/16/2021 Left ?07/16/2021   ?Hip flexion 4+/5 4+/5  ?Hip extension 4-/5 4-/5  ?Hip abduction 4+/5 4+/5  ?Hip adduction    ?Hip internal rotation 4+/5 4+/5  ?Hip external rotation 4+/5 4+/5  ?Knee flexion 5/5 5/5  ?Knee extension 5/5 5/5  ?Ankle dorsiflexion 5/5 5/5  ?Ankle plantarflexion    ?Ankle inversion    ?Ankle eversion    ? (Blank rows = not tested) ? ? ? ?FUNCTIONAL TESTS:  ?Stairs:WFL with alternating pattern ?Squat: slight weight shift off LLE with deep squat ? ?GAIT: ?Distance walked: 100 ?Assistive device utilized: None ?Level of assistance: Complete Independence ?Comments: WFL ? ? ? ?TODAY'S TREATMENT: ?07/16/21 ?Bridge 2x 10 3 second holds ?STS 2x 10  ? ? ?PATIENT EDUCATION:  ?PATIENT EDUCATION:  ?Education details: Patient educated on exam findings, POC, scope of PT, HEP, and importance of hip/core strength. ?Person educated: Patient ?Education method: Explanation, Demonstration, and Handouts ?Education comprehension: verbalized understanding, returned demonstration, verbal cues required, and tactile cues required ? ? ?HOME EXERCISE PROGRAM: ?07/16/21 Access Code: [email or web address] ?- Supine Bridge  - 1-3 x daily - 7 x weekly - 2 sets - 10 reps - 3 second hold ?- Sit to Stand with Arms Crossed  - 1-3 x daily - 7 x weekly - 2 sets - 10 reps ? ?ASSESSMENT: ? ?CLINICAL IMPRESSION: ?Patient a 76 y.o. y.o. male who was seen today for physical therapy evaluation and treatment for L hip pain. Patient will continue to benefit from physical therapy in order to improve function and reduce impairment. ? ? ?OBJECTIVE IMPAIRMENTS decreased balance, decreased mobility, difficulty walking, decreased strength, and improper body mechanics.  ? ?ACTIVITY LIMITATIONS cleaning, community activity, meal prep, yard work, shopping, yard work, and golf .  ? ?PERSONAL FACTORS Age and Time since onset of injury/illness/exacerbation are also affecting patient's functional outcome.  ? ? ?REHAB POTENTIAL: Good ? ?CLINICAL DECISION MAKING:  Stable/uncomplicated ? ?EVALUATION COMPLEXITY: Low ? ? ?GOALS: ?Goals reviewed with patient? No ? ?SHORT TERM GOALS: Target date: 08/06/2021 ? ?Patient will be independent with HEP in order to improve functional outcomes. ?Baseline:  ?Goal status: INITIAL ? ?2.  Patient will report at least 25% improvement in symptoms for improved quality of life. ?Baseline:  ?Goal status: INITIAL ? ? ?LONG TERM GOALS: Target date: 08/13/2021 ? ?Patient will report at least 75% improvement in symptoms for improved quality of life. ?Baseline:  ?Goal status: INITIAL ? ?2.  Patient will demonstrate at least 4+/5 for all hip MMT for improved glute strength with golfing.  ?Baseline: see MMT ?Goal status: INITIAL ? ?3.  Patient will be able golf without increase in symptoms following.  ?Baseline:  ?Goal status: INITIAL ? ? ? ? ?PLAN: ?PT FREQUENCY: 1x/week ? ?PT DURATION: 4 weeks ? ?PLANNED INTERVENTIONS: Therapeutic exercises, Therapeutic activity, Neuromuscular re-education, Balance training, Gait training, Patient/Family education, Joint manipulation,  Joint mobilization, Stair training, Orthotic/Fit training, DME instructions, Aquatic Therapy, Dry Needling, Electrical stimulation, Spinal manipulation, Spinal mobilization, Cryotherapy, Moist heat, Compression bandaging, scar mobilization, Splintting, Taping, Traction, Ultrasound, Ionotophoresis '4mg'$ /ml Dexamethasone, and Manual therapy ? ?PLAN FOR NEXT SESSION: glute strength, functional strength ? ? ?Mearl Latin, PT ?07/16/2021, 2:45 PM  ?

## 2021-07-16 NOTE — Patient Instructions (Signed)
Access Code: [email or web address] ?URL: [email or web address] ?Date: 07/16/2021 ?Prepared by: Mitzi Hansen Kadajah Kjos ? ?Exercises ?- Supine Bridge  - 1-3 x daily - 7 x weekly - 2 sets - 10 reps - 3 second hold ?- Sit to Stand with Arms Crossed  - 1-3 x daily - 7 x weekly - 2 sets - 10 reps ?

## 2021-07-29 ENCOUNTER — Encounter (HOSPITAL_COMMUNITY): Payer: Medicare HMO

## 2021-07-30 ENCOUNTER — Ambulatory Visit: Payer: Self-pay

## 2021-07-30 ENCOUNTER — Ambulatory Visit: Payer: Medicare HMO | Admitting: Surgery

## 2021-07-30 ENCOUNTER — Encounter: Payer: Self-pay | Admitting: Surgery

## 2021-07-30 VITALS — BP 165/90 | HR 78

## 2021-07-30 DIAGNOSIS — M542 Cervicalgia: Secondary | ICD-10-CM

## 2021-07-30 DIAGNOSIS — M4722 Other spondylosis with radiculopathy, cervical region: Secondary | ICD-10-CM | POA: Diagnosis not present

## 2021-07-30 MED ORDER — METHYLPREDNISOLONE 4 MG PO TABS: ORAL_TABLET | ORAL | 0 refills | Status: AC

## 2021-07-30 MED ORDER — METHOCARBAMOL 500 MG PO TABS: 500.0000 mg | ORAL_TABLET | Freq: Three times a day (TID) | ORAL | 0 refills | Status: AC | PRN

## 2021-07-30 NOTE — Progress Notes (Signed)
? ?Office Visit Note ?  ?Patient: Darryl Phillips           ?Date of Birth: 1945/05/02           ?MRN: [id number] ?Visit Date: 07/30/2021 ?             ?Requested by: Sharilyn Sites, MD ?[address] ?[place],  [place] [address] ?PCP: Sharilyn Sites, MD ? ? ?Assessment & Plan: ?Visit Diagnoses:  ?1. Neck pain   ?2. Other spondylosis with radiculopathy, cervical region   ? ? ?Plan: I reviewed cervical spine x-rays with patient today.  He does have multilevel cervical spondylosis.  We will attempt conservative treatment with prednisone 6-day taper.  Also sent in Robaxin for spasms.  Follow-up Dr. Lorin Mercy in 3 weeks for recheck.  If he continues to have ongoing symptoms may consider getting a cervical MRI. ? ?Follow-Up Instructions: Return in about 3 weeks (around 08/20/2021) for With Dr. Lorin Mercy for recheck cervical spondylosis.  Question needing MRI.  ? ?Orders:  ?Orders Placed This Encounter  ?Procedures  ? XR Cervical Spine 2 or 3 views  ? ?Meds ordered this encounter  ?Medications  ? methylPREDNISolone (MEDROL) 4 MG tablet  ?  Sig: 6-day taper to be taken as directed  ?  Dispense:  21 tablet  ?  Refill:  0  ? methocarbamol (ROBAXIN) 500 MG tablet  ?  Sig: Take 1 tablet (500 mg total) by mouth every 8 (eight) hours as needed for muscle spasms.  ?  Dispense:  30 tablet  ?  Refill:  0  ? ? ? ? Procedures: ?No procedures performed ? ? ?Clinical Data: ?No additional findings. ? ? ?Subjective: ?Chief Complaint  ?Patient presents with  ? Neck - Pain  ? ? ?HPI ?76 year old white male presents in office today with complaints of neck pain and bilateral arm numbness and tingling.  Patient states that he has a known history of bone spurs in his neck and was seen by Dr. Lorin Mercy about 10 years ago for this.  Has had chronic issues with neck pain over the years but this flareup has been going on for about 4 days.  Pain radiates into the bilateral shoulder blades.  No radicular pain into his arms but he does have constant numbness and  tingling in both forearms and hands.  No complaints of weakness.  Also has neck stiffness. ?Review of Systems ?No current cardiac pulmonary GI GU issues ? ?Objective: ?Vital Signs: BP (!) 165/90   Pulse 78  ? ?Physical Exam ?HENT:  ?   Head: Normocephalic and atraumatic.  ?   Nose: Nose normal.  ?Pulmonary:  ?   Effort: No respiratory distress.  ?Musculoskeletal:  ?   Comments: Gait is normal.  Patient has bilateral brachial plexus trapezius and scapular tenderness.  No weakness.  Bilateral elbows unremarkable.  Negative Tinel's bilateral elbows and wrist.  ?Neurological:  ?   Mental Status: He is alert and oriented to person, place, and time.  ? ? ?Ortho Exam ? ?Specialty Comments:  ?No specialty comments available. ? ?Imaging: ?No results found. ? ? ?PMFS History: ?Patient Active Problem List  ? Diagnosis Date Noted  ? Degenerative arthritis of right wrist 02/04/2021  ? Trigger finger, left ring finger 02/04/2021  ? 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 Hemorrhoids 12/20/2018  ? Fatigue 11/28/2018  ? Hematochezia 11/28/2018  ? Special screening for malignant neoplasms, colon 11/11/2017  ? History of malignant melanoma 01/24/2014  ? Irritable bowel  syndrome 01/24/2014  ? GERD (gastroesophageal reflux disease) 11/21/2013  ? Unspecified hypothyroidism 11/21/2013  ? Glaucoma 11/21/2013  ? ?Past Medical History:  ?Diagnosis Date  ? Cancer [facility])   ? Melanoma back 2009  ? Glaucoma   ? Shingles   ? Thyroid disease   ?  ?History reviewed. No pertinent family history.  ?Past Surgical History:  ?Procedure Laterality Date  ? cataract surgery    ? [employer] 2018. Also has a stent in his eye during surgery  ? CHOLECYSTECTOMY    ? Dr. Arnoldo Morale  ? COLONOSCOPY N/A 03/28/2018  ? Procedure: COLONOSCOPY;  Surgeon: Rogene Houston, MD;  [facility];  Service: Endoscopy;  Laterality: N/A;  830 - pt agreed to move per Tammy  ? ESOPHAGOGASTRODUODENOSCOPY N/A 12/14/2013  ? Procedure:  ESOPHAGOGASTRODUODENOSCOPY (EGD);  Surgeon: Rogene Houston, MD;  [facility];  Service: Endoscopy;  Laterality: N/A;  300  ? melonoma    ? x 4 sugeries  ? POLYPECTOMY  03/28/2018  ? Procedure: POLYPECTOMY;  Surgeon: Rogene Houston, MD;  [facility];  Service: Endoscopy;;  colon  ? ?Social History  ? ?Occupational History  ? Not on file  ?Tobacco Use  ? Smoking status: Former  ?  Types: Cigarettes  ?  Quit date: 01/23/1969  ?  Years since quitting: 52.5  ? Smokeless tobacco: Never  ?Substance and Sexual Activity  ? Alcohol use: No  ?  Alcohol/week: 0.0 standard drinks  ? Drug use: No  ? Sexual activity: Not on file  ? ? ? ? ? ? ?

## 2021-08-01 DIAGNOSIS — H34832 Tributary (branch) retinal vein occlusion, left eye, with macular edema: Secondary | ICD-10-CM | POA: Diagnosis not present

## 2021-08-04 ENCOUNTER — Ambulatory Visit: Payer: Medicare HMO | Admitting: Orthopaedic Surgery

## 2021-08-04 ENCOUNTER — Encounter: Payer: Self-pay | Admitting: Orthopaedic Surgery

## 2021-08-04 ENCOUNTER — Other Ambulatory Visit: Payer: Self-pay

## 2021-08-04 ENCOUNTER — Encounter (HOSPITAL_COMMUNITY): Payer: Medicare HMO | Admitting: Physical Therapy

## 2021-08-04 DIAGNOSIS — M25552 Pain in left hip: Secondary | ICD-10-CM

## 2021-08-04 NOTE — Progress Notes (Signed)
The patient is a 76 year old gentleman well-known to me.  He has well-documented just mild arthritis in his left hip and it only hurts in the groin.  His x-rays still show a well-maintained hip joint.  He says the pain is flared up recently and for the first time he would like to consider an intra-articular injection in that left hip joint.  He has had no acute changes in medical status.  He is not a diabetic.  He walks without assistive device and no limp.  His pain is only in the groin. ? ?On exam his left hip moves smoothly and fluidly with just a touch of pain in the groin area. ? ?He is interested in having an intra-articular steroid injection of the left hip we will see if we can get Dr. Ernestina Patches to do that in the near future.  All questions and concerns were answered and addressed.  After he sees Dr. Ernestina Patches follow-up can really be as needed less things cleared up for him. ?

## 2021-08-12 ENCOUNTER — Encounter (HOSPITAL_COMMUNITY): Payer: Self-pay | Admitting: Physical Therapy

## 2021-08-12 ENCOUNTER — Ambulatory Visit (HOSPITAL_COMMUNITY): Payer: Medicare HMO | Admitting: Physical Therapy

## 2021-08-12 NOTE — Therapy (Signed)
[place] [facility] ?[address] ?[place], [place], [address] ?Phone: [phone]   Fax:  [phone] ? ?Patient Details  ?Name: Darryl Phillips ?MRN: [id number] ?Date of Birth: 06-Jul-1945 ?Referring Provider:  No ref. provider found ? ?Encounter Date: 08/12/2021 ? ?PHYSICAL THERAPY DISCHARGE SUMMARY ? ?Visits from Start of Care: 1 ? ?Current functional level related to goals / functional outcomes: ?Unknown as patient has not returned. Per front desk staff, "pt called to cx his appts stated that he is getting a cortisone injection just d/c him he knows to call back with a new order if he wants to come back" ?  ?Remaining deficits: ?Unknown ?  ?Education / Equipment: ?HEP  ? ?Patient agrees to discharge. Patient goals were not met. Patient is being discharged due to the patient's request. ? ? ? ?8:37 AM, 08/12/21 ?Mearl Latin PT, DPT ?Physical Therapist at [facility] ?[facility] ? ? ? ?[place] ?[address] ?[place], [place], [address] ?Phone: [phone]   Fax:  [phone] ?

## 2021-08-13 ENCOUNTER — Telehealth: Payer: Self-pay | Admitting: Physical Medicine and Rehabilitation

## 2021-08-13 NOTE — Telephone Encounter (Signed)
Darryl Phillips is a patient of Dr. Ninfa Linden. He is being referred to [facility] for a hip injection. He is returning a call from the office to try and get an appointment scheduled. Please advise.  ?

## 2021-08-20 ENCOUNTER — Ambulatory Visit: Payer: Medicare HMO | Admitting: Orthopaedic Surgery

## 2021-08-20 ENCOUNTER — Ambulatory Visit (HOSPITAL_COMMUNITY): Payer: Medicare HMO

## 2021-08-29 ENCOUNTER — Ambulatory Visit (INDEPENDENT_AMBULATORY_CARE_PROVIDER_SITE_OTHER): Payer: Medicare HMO | Admitting: Physical Medicine and Rehabilitation

## 2021-08-29 ENCOUNTER — Encounter: Payer: Self-pay | Admitting: Physical Medicine and Rehabilitation

## 2021-08-29 ENCOUNTER — Ambulatory Visit: Payer: Self-pay

## 2021-08-29 DIAGNOSIS — M25552 Pain in left hip: Secondary | ICD-10-CM | POA: Diagnosis not present

## 2021-08-29 NOTE — Progress Notes (Signed)
Pt state left hip pain. Pt state any quick turn or twist makes the pain worse. Pt state he takes pain meds and PT to help ease his pain.  Numeric Pain Rating Scale and Functional Assessment Average Pain 2   In the last MONTH (on 0-10 scale) has pain interfered with the following?  1. General activity like being  able to carry out your everyday physical activities such as walking, climbing stairs, carrying groceries, or moving a chair?  Rating(6)   -BT, -Dye Allergies.

## 2021-09-03 MED ORDER — BUPIVACAINE HCL 0.25 % IJ SOLN
4.0000 mL | INTRAMUSCULAR | Status: AC | PRN
  Administered 2021-08-29: 4 mL via INTRA_ARTICULAR

## 2021-09-03 MED ORDER — TRIAMCINOLONE ACETONIDE 40 MG/ML IJ SUSP
60.0000 mg | INTRAMUSCULAR | Status: AC | PRN
  Administered 2021-08-29: 60 mg via INTRA_ARTICULAR

## 2021-09-03 NOTE — Progress Notes (Signed)
   Darryl Phillips - 76 y.o. male MRN [id number]  Date of birth: Aug 21, 1945  Office Visit Note: Visit Date: 08/29/2021 PCP: Sharilyn Sites, MD Referred by: Sharilyn Sites, MD  Subjective: Chief Complaint  Patient presents with   Left Hip - Pain   HPI:  Darryl Phillips is a 76 y.o. male who comes in todayHPI ROS Otherwise per HPI.  Assessment & Plan: Visit Diagnoses:    ICD-10-CM   1. Pain in left hip  M25.552 XR C-ARM NO REPORT      Plan: No additional findings.   Meds & Orders: No orders of the defined types were placed in this encounter.   Orders Placed This Encounter  Procedures   Large Joint Inj   XR C-ARM NO REPORT    Follow-up: Return if symptoms worsen or fail to improve.   Procedures: Large Joint Inj: L hip joint on 08/29/2021 8:15 AM Indications: diagnostic evaluation and pain Details: 22 G 3.5 in needle, fluoroscopy-guided anterior approach  Arthrogram: No  Medications: 4 mL bupivacaine 0.25 %; 60 mg triamcinolone acetonide 40 MG/ML Outcome: tolerated well, no immediate complications  There was excellent flow of contrast producing a partial arthrogram of the hip. The patient did have relief of symptoms during the anesthetic phase of the injection. Procedure, treatment alternatives, risks and benefits explained, specific risks discussed. Consent was given by the patient. Immediately prior to procedure a time out was called to verify the correct patient, procedure, equipment, support staff and site/side marked as required. Patient was prepped and draped in the usual sterile fashion.         Clinical History: No specialty comments available.     Objective:  VS:  HT:    WT:   BMI:     BP:   HR: bpm  TEMP: ( )  RESP:  Physical Exam   Imaging: No results found.

## 2021-10-01 DIAGNOSIS — H401122 Primary open-angle glaucoma, left eye, moderate stage: Secondary | ICD-10-CM | POA: Diagnosis not present

## 2021-10-17 DIAGNOSIS — H34832 Tributary (branch) retinal vein occlusion, left eye, with macular edema: Secondary | ICD-10-CM | POA: Diagnosis not present

## 2021-10-31 ENCOUNTER — Ambulatory Visit: Payer: Medicare HMO | Admitting: Orthopedic Surgery

## 2021-10-31 DIAGNOSIS — M19031 Primary osteoarthritis, right wrist: Secondary | ICD-10-CM | POA: Diagnosis not present

## 2021-10-31 MED ORDER — BETAMETHASONE SOD PHOS & ACET 6 (3-3) MG/ML IJ SUSP
6.0000 mg | INTRAMUSCULAR | Status: AC | PRN
  Administered 2021-10-31: 6 mg via INTRA_ARTICULAR

## 2021-10-31 MED ORDER — LIDOCAINE HCL 1 % IJ SOLN
1.0000 mL | INTRAMUSCULAR | Status: AC | PRN
  Administered 2021-10-31: 1 mL

## 2021-10-31 NOTE — Progress Notes (Signed)
Office Visit Note   Patient: Darryl Phillips           Date of Birth: 30-Nov-1945           MRN: [id number] Visit Date: 10/31/2021              Requested by: Sharilyn Sites, [address] [place],  [place] [address] PCP: Sharilyn Sites, MD   Assessment & Plan: Visit Diagnoses:  1. Primary osteoarthritis of right wrist     Plan: Patient presents w/ continued right wrist pain in setting of severe degenerative radiocarpal and midcarpal osteoarthritis.  We again reviewed the nature of wrist arthritis and both conservative and surgical treatment options.  He would like to try another corticosteroid injection.  I can see him back again as needed.   Follow-Up Instructions: No follow-ups on file.   Orders:  No orders of the defined types were placed in this encounter.  No orders of the defined types were placed in this encounter.     Procedures: Medium Joint Inj: R radiocarpal on 10/31/2021 3:00 PM Indications: joint swelling and pain Details: 25 G 1.5 in needle, dorsal approach Medications: 6 mg betamethasone acetate-betamethasone sodium phosphate 6 (3-3) MG/ML; 1 mL lidocaine 1 % Procedure, treatment alternatives, risks and benefits explained, specific risks discussed. Consent was given by the patient. Immediately prior to procedure a time out was called to verify the correct patient, procedure, equipment, support staff and site/side marked as required. Patient was prepped and draped in the usual sterile fashion.       Clinical Data: No additional findings.   Subjective: Chief Complaint  Patient presents with   Right Wrist - Follow-up    This is a 76 year old right-hand-dominant male who presents for follow-up of chronic right wrist pain.  He was last seen in March at which time he underwent corticosteroid injection into the radiocarpal joint.  He got significant symptom relief for a month or two.  He continues to describe dull, aching pain in the wrist.  His pain is  worse w/ certain activities.  He is still playing golf and able to do yard work and other daily activities.     Review of Systems   Objective: Vital Signs: There were no vitals taken for this visit.  Physical Exam  Right Hand Exam   Tenderness  Right hand tenderness location: TTP at central and radial aspect of dorsal wrist w/ mild to moderate swelling.  Other  Erythema: absent Sensation: normal Pulse: present  Comments:  Severely limited wrist ROM in flex/extension/radial/ulnar deviation.  Full ROM of fingers.       Specialty Comments:  No specialty comments available.  Imaging: No results found.   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  Past Surgical History:  Procedure Laterality Date  cataract surgery     [facility] 2018. Also has a stent in his eye during surgery   CHOLECYSTECTOMY     Dr. Arnoldo Morale   COLONOSCOPY N/A 03/28/2018   Procedure: COLONOSCOPY;  Surgeon: Rogene Houston, MD;  Location: [facility];  Service: Endoscopy;  Laterality: N/A;  830 - pt agreed to move per Tammy   ESOPHAGOGASTRODUODENOSCOPY N/A 12/14/2013   Procedure: ESOPHAGOGASTRODUODENOSCOPY (EGD);  Surgeon: Rogene Houston, MD;  Location: [facility];  Service: Endoscopy;  Laterality: N/A;  300   melonoma     x 4  sugeries   POLYPECTOMY  03/28/2018   Procedure: POLYPECTOMY;  Surgeon: Rogene Houston, MD;  Location: [facility];  Service: Endoscopy;;  colon   Social History   Occupational History   Not on file  Tobacco Use   Smoking status: Former    Types: Cigarettes    Quit date: 01/23/1969    Years since quitting: 52.8   Smokeless tobacco: Never  Substance and Sexual Activity   Alcohol use: No    Alcohol/week: 0.0 standard drinks of alcohol   Drug use: No   Sexual activity: Not on file

## 2021-11-11 DIAGNOSIS — M1990 Unspecified osteoarthritis, unspecified site: Secondary | ICD-10-CM | POA: Diagnosis not present

## 2021-11-11 DIAGNOSIS — M5136 Other intervertebral disc degeneration, lumbar region: Secondary | ICD-10-CM | POA: Diagnosis not present

## 2021-11-11 DIAGNOSIS — Z6826 Body mass index (BMI) 26.0-26.9, adult: Secondary | ICD-10-CM | POA: Diagnosis not present

## 2021-11-11 DIAGNOSIS — R0789 Other chest pain: Secondary | ICD-10-CM | POA: Diagnosis not present

## 2021-11-13 DIAGNOSIS — R404 Transient alteration of awareness: Secondary | ICD-10-CM | POA: Diagnosis not present

## 2021-11-28 DIAGNOSIS — 419620001 Death: Secondary | SNOMED CT | POA: Diagnosis not present

## 2021-11-28 DEATH — deceased

## 2022-06-15 ENCOUNTER — Ambulatory Visit (INDEPENDENT_AMBULATORY_CARE_PROVIDER_SITE_OTHER): Payer: Medicare HMO | Admitting: Gastroenterology
